# Patient Record
Sex: Male | Born: 1965 | Race: White | Hispanic: No | Marital: Married | Smoking: Current every day smoker
Health system: Southern US, Community
[De-identification: ages and names within clinical notes are randomized; demographics above are authoritative.]

## PROBLEM LIST (undated history)

## (undated) DIAGNOSIS — A6000 Herpesviral infection of urogenital system, unspecified: Secondary | ICD-10-CM

## (undated) DIAGNOSIS — N200 Calculus of kidney: Secondary | ICD-10-CM

## (undated) DIAGNOSIS — N400 Enlarged prostate without lower urinary tract symptoms: Secondary | ICD-10-CM

## (undated) DIAGNOSIS — N486 Induration penis plastica: Secondary | ICD-10-CM

## (undated) HISTORY — DX: Calculus of kidney: N20.0

## (undated) HISTORY — PX: URETEROSCOPY WITH HOLMIUM LASER LITHOTRIPSY: SHX6645

---

## 2009-03-11 ENCOUNTER — Emergency Department: Payer: Self-pay | Admitting: Emergency Medicine

## 2014-02-15 DIAGNOSIS — F17200 Nicotine dependence, unspecified, uncomplicated: Secondary | ICD-10-CM | POA: Insufficient documentation

## 2014-02-15 DIAGNOSIS — R030 Elevated blood-pressure reading, without diagnosis of hypertension: Secondary | ICD-10-CM | POA: Insufficient documentation

## 2016-10-21 DIAGNOSIS — Z87442 Personal history of urinary calculi: Secondary | ICD-10-CM | POA: Insufficient documentation

## 2016-11-11 ENCOUNTER — Encounter: Payer: Self-pay | Admitting: Urology

## 2016-11-11 ENCOUNTER — Ambulatory Visit (INDEPENDENT_AMBULATORY_CARE_PROVIDER_SITE_OTHER): Payer: Self-pay | Admitting: Urology

## 2016-11-11 VITALS — BP 147/80 | HR 82 | Ht 69.0 in | Wt 247.9 lb

## 2016-11-11 DIAGNOSIS — N486 Induration penis plastica: Secondary | ICD-10-CM

## 2016-11-11 DIAGNOSIS — R3911 Hesitancy of micturition: Secondary | ICD-10-CM

## 2016-11-11 LAB — URINALYSIS, COMPLETE
Bilirubin, UA: NEGATIVE
GLUCOSE, UA: NEGATIVE
Ketones, UA: NEGATIVE
Nitrite, UA: NEGATIVE
PH UA: 8.5 — AB (ref 5.0–7.5)
PROTEIN UA: NEGATIVE
Specific Gravity, UA: 1.015 (ref 1.005–1.030)
Urobilinogen, Ur: 0.2 mg/dL (ref 0.2–1.0)

## 2016-11-11 LAB — MICROSCOPIC EXAMINATION
EPITHELIAL CELLS (NON RENAL): NONE SEEN /HPF (ref 0–10)
RBC, UA: NONE SEEN /hpf (ref 0–?)

## 2016-11-11 LAB — BLADDER SCAN AMB NON-IMAGING: Scan Result: 74

## 2016-11-11 MED ORDER — SILDENAFIL CITRATE 20 MG PO TABS: 20.0000 mg | ORAL_TABLET | Freq: Every day | ORAL | 5 refills | Status: DC | PRN

## 2016-11-11 NOTE — Progress Notes (Signed)
11/11/2016 12:52 PM   Daniel Castillo 08-Jul-1965 [id number]  Referring provider: Barbette Reichmann, MD [address] [facility] [place], [place] [address]  No chief complaint on file.   HPI: Pt seen today for a "knot" on the penis and some penile curvature. Some mild soreness with erection. He's noticed it for about a month. Not as sexually active right now and not in a relationship. His erections are adequate, but not "completely" hard and they are softer than in the past. Ejaculation seems normal. He has occasional hesitancy, weak stream, intermittent flow, straining to void. He feels like he empties. PVR 74 ml. He's not bothered by any urinary symptoms.   Modifying factors: There are no other modifying factors  Associated signs and symptoms: There are no other associated signs and symptoms Aggravating and relieving factors: There are no other aggravating or relieving factors Severity: Moderate Duration: Persistent  He was talking a T "pill" by mouth he bought from a store. He lifts weights.   PMH: No past medical history on file.  Surgical History: No past surgical history on file.  Home Medications:  Allergies as of 11/11/2016   Not on File     Medication List    as of 11/11/2016 12:52 PM   You have not been prescribed any medications.     Allergies: Allergies not on file  Family History: No family history on file.  Social History:  has no tobacco, alcohol, and drug history on file.  ROS:                                        Physical Exam: There were no vitals taken for this visit.  Constitutional:  Alert and oriented, No acute distress. HEENT: [place] AT, moist mucus membranes.  Trachea midline, no masses. Cardiovascular: No clubbing, cyanosis, or edema. Respiratory: Normal respiratory effort, no increased work of breathing. GI: Abdomen is soft, nontender, nondistended, no abdominal masses GU: No CVA tenderness.    Penis: hard plaque dorsal, proximal about 10 mm, a little left of center. Circumcised and no lesion. No mass.  Scrotum: normal; testes palpably normal  DRE: 20 g prostate , normal, no hard area or nodule  Skin: No rashes, bruises or suspicious lesions. Lymph: No cervical or inguinal adenopathy. Neurologic: Grossly intact, no focal deficits, moving all 4 extremities. Psychiatric: Normal mood and affect.  Laboratory Data: No results found for: WBC, HGB, HCT, MCV, PLT  No results found for: CREATININE  No results found for: PSA1  No results found for: TESTOSTERONE  No results found for: HGBA1C  Urinalysis No results found for: SPECGRAV, PHUR, COLORU, APPEARANCEUR, LEUKOCYTESUR, PROTEINUR, GLUCOSEU, KETONESU, RBCU, BILIRUBINUR, UUROB, NITRITE  No results found for: LABMICR, WBCUA, RBCUA, LABEPIT, MUCUS, BACTERIA    Assessment & Plan:    1) Peyronie's - he has a dime-sized plaque on the dorsal proximal surface of the penis. A little further on the left side along the neurovascular bundle. We discussed the benign nature of Peyronie's disease and the natural history. I encouraged him to get an erection a few times a week and take sildenafil to promote blood flow and a good erection. We discussed to be careful to avoid any penile bending or flexion to prevent further trauma. We'll see him back in about 3 months to for symptom check and exam. We discussed injections such as Xiaflex if he ever  needs it in the future and I gave him some handouts on Peyronies and Xiaflex.   2) Hesitancy - small prostate, benign. UA and PVR normal. Will follow.   There are no diagnoses linked to this encounter.  No Follow-up on file.  Jerilee Field, MD  [facility] [address], [address] [place], [place] [address] [phone]

## 2017-02-12 ENCOUNTER — Encounter: Payer: Self-pay | Admitting: Urology

## 2017-02-12 ENCOUNTER — Ambulatory Visit (INDEPENDENT_AMBULATORY_CARE_PROVIDER_SITE_OTHER): Payer: Self-pay | Admitting: Urology

## 2017-02-12 VITALS — BP 167/82 | HR 94 | Ht 69.0 in | Wt 260.6 lb

## 2017-02-12 DIAGNOSIS — N486 Induration penis plastica: Secondary | ICD-10-CM

## 2017-02-12 DIAGNOSIS — N529 Male erectile dysfunction, unspecified: Secondary | ICD-10-CM

## 2017-02-12 NOTE — Progress Notes (Signed)
02/12/2017 4:03 PM   [name] Jul 28, 1965 [id number]  Referring provider: Barbette Reichmann, MD [address] [facility] [place], [place] [address]  Chief Complaint  Patient presents with  . Follow-up    HPI: The patient is a 52 year old gentleman who presents today for 3 month follow-up of a dime sized Perrone's plaque on the dorsal proximal surface of his penis he first noticed.  This curvature, plaque, and pain with erections approximately 6 months ago.  He continues to have spontaneous erections at night that are painful at the site of this plaque.  He estimates of 40 degree curvature upward.  He is not currently sexually active. The curvature is not currently concerning to him.  It is more the pain with erections.  He is concerned it might be a risk factor for something more serious per  Patient was also started on sildenafil at his last appointment because he was not status of the firmness of his erections.  However, at that time he was not sexually active.  He is only tried this medication once with mixed results  PMH: Past Medical History:  Diagnosis Date  . Kidney stones     Surgical History: Past Surgical History:  Procedure Laterality Date  . URETEROSCOPY WITH HOLMIUM LASER LITHOTRIPSY      Home Medications:  Allergies as of 02/12/2017   No Known Allergies     Medication List        Accurate as of 02/12/17  4:03 PM. Always use your most recent med list.          sildenafil 20 MG tablet Commonly known as:  REVATIO Take 1 tablet (20 mg total) by mouth daily as needed. Take 1-5 tablets as needed       Allergies: No Known Allergies  Family History: Family History  Problem Relation Age of Onset  . Lung cancer Mother   . Prostate cancer Neg Hx   . Bladder Cancer Neg Hx   . Kidney cancer Neg Hx     Social History:  reports that he has been smoking.  he has never used smokeless tobacco. His alcohol and drug histories are not on  file.  ROS: UROLOGY Frequent Urination?: No Hard to postpone urination?: No Burning/pain with urination?: No Get up at night to urinate?: No Leakage of urine?: No Urine stream starts and stops?: No Trouble starting stream?: Yes Do you have to strain to urinate?: No Blood in urine?: No Urinary tract infection?: No Sexually transmitted disease?: No Injury to kidneys or bladder?: No Painful intercourse?: No Weak stream?: No Erection problems?: No Penile pain?: Yes  Gastrointestinal Nausea?: No Vomiting?: No Indigestion/heartburn?: No Diarrhea?: No Constipation?: No  Constitutional Fever: No Night sweats?: No Weight loss?: No Fatigue?: No  Skin Skin rash/lesions?: No Itching?: No  Eyes Blurred vision?: No Double vision?: No  Ears/Nose/Throat Sore throat?: No Sinus problems?: No  Hematologic/Lymphatic Swollen glands?: No Easy bruising?: No  Cardiovascular Leg swelling?: No Chest pain?: No  Respiratory Cough?: No Shortness of breath?: No  Endocrine Excessive thirst?: No  Musculoskeletal Back pain?: No Joint pain?: No  Neurological Headaches?: No Dizziness?: No  Psychologic Depression?: No Anxiety?: No  Physical Exam: BP (!) 167/82 (BP Location: Right Arm, Patient Position: Sitting, Cuff Size: Large)   Pulse 94   Ht 5\' 9"  (1.753 m)   Wt 260 lb 9.6 oz (118.2 kg)   BMI 38.48 kg/m   Constitutional:  Alert and oriented, No acute distress. HEENT:   AT, moist mucus membranes.  Trachea midline, no masses. Cardiovascular: No clubbing, cyanosis, or edema. Respiratory: Normal respiratory effort, no increased work of breathing. GI: Abdomen is soft, nontender, nondistended, no abdominal masses GU: No CVA tenderness.  Dime-sized plaque at the base of the dorsal side of the penis consistent with Peyronnie's disease Skin: No rashes, bruises or suspicious lesions. Lymph: No cervical or inguinal adenopathy. Neurologic: Grossly intact, no focal  deficits, moving all 4 extremities. Psychiatric: Normal mood and affect.  Laboratory Data: No results found for: WBC, HGB, HCT, MCV, PLT  No results found for: CREATININE  No results found for: PSA  No results found for: TESTOSTERONE  No results found for: HGBA1C  Urinalysis    Component Value Date/Time   APPEARANCEUR Clear 11/11/2016 1311   GLUCOSEU Negative 11/11/2016 1311   BILIRUBINUR Negative 11/11/2016 1311   PROTEINUR Negative 11/11/2016 1311   NITRITE Negative 11/11/2016 1311   LEUKOCYTESUR Trace (A) 11/11/2016 1311    Assessment & Plan:    1.  Peyronnie's disease I discussed with the patient the 2 phases of Peyronnie's disease.  He did discuss there is only symptomatic relief with NSAIDs during the active phase which last approximately 1 year.  This is when he will have pain during erections.  Based on symptomology and when symptoms began, I suspect that his active phase would and in the summer 2019.  We did discuss Xiaflex as a treatment to straighten the penis during the stable phase.  We discussed the risk and benefits of this.  The patient is not bothered by his curvature currently is not interested in pursuing Xiaflex currently when he enters a stable phase.  2.  Erectile dysfunction The patient will continue to try sildenafil as needed.  He will follow-up annually for this medication.  He will follow-up sooner if he decides to pursue Xiaflex this summer.  Return in about 1 year (around 02/12/2018).  Hildred LaserBrian James Constantino Starace, MD  [facility] [address], [address] [place], [place] [id number] [phone]

## 2018-02-10 ENCOUNTER — Ambulatory Visit (INDEPENDENT_AMBULATORY_CARE_PROVIDER_SITE_OTHER): Payer: Self-pay | Admitting: Urology

## 2018-02-10 ENCOUNTER — Encounter: Payer: Self-pay | Admitting: Urology

## 2018-02-10 VITALS — BP 139/87 | HR 91 | Ht 69.0 in | Wt 250.0 lb

## 2018-02-10 DIAGNOSIS — Z8619 Personal history of other infectious and parasitic diseases: Secondary | ICD-10-CM

## 2018-02-10 DIAGNOSIS — N486 Induration penis plastica: Secondary | ICD-10-CM | POA: Insufficient documentation

## 2018-02-10 MED ORDER — VALACYCLOVIR HCL 500 MG PO TABS: ORAL_TABLET | ORAL | 0 refills | Status: DC

## 2018-02-10 NOTE — Progress Notes (Signed)
02/10/2018 4:13 PM   [name] 11-22-1965 [id number]  Referring provider: Barbette Reichmann, MD [address] [facility] [place], [place] [address]  Chief Complaint  Patient presents with  . Abnormal Penile Curvature    1 yr follow up    HPI: 53 year old male previously seen by Dr. Mena Goes and Dr. Sherryl Barters for Peyronie's disease and erectile dysfunction.  He presents for annual follow-up.  At his visit last year he was having painful erections and upward curvature the penis estimated at 30 degrees.  He was not sexually active.  He states there has been no significant change in the curvature and he remains sexually inactive.  His penile pain with erections have resolved.  He states he contracted herpes approximately 20 years ago and has 2-3 outbreaks per year.  He is asking for medication to control these symptoms.  He has no bothersome lower urinary tract symptoms.   PMH: Past Medical History:  Diagnosis Date  . Kidney stones     Surgical History: Past Surgical History:  Procedure Laterality Date  . URETEROSCOPY WITH HOLMIUM LASER LITHOTRIPSY      Home Medications:  Allergies as of 02/10/2018   No Known Allergies     Medication List       Accurate as of February 10, 2018  4:13 PM. Always use your most recent med list.        sildenafil 20 MG tablet Commonly known as:  REVATIO Take 1 tablet (20 mg total) by mouth daily as needed. Take 1-5 tablets as needed       Allergies: No Known Allergies  Family History: Family History  Problem Relation Age of Onset  . Lung cancer Mother   . Prostate cancer Neg Hx   . Bladder Cancer Neg Hx   . Kidney cancer Neg Hx     Social History:  reports that he has been smoking. He has never used smokeless tobacco. No history on file for alcohol and drug.  ROS: UROLOGY Frequent Urination?: No Hard to postpone urination?: No Burning/pain with urination?: No Get up at night to urinate?: No Leakage  of urine?: No Urine stream starts and stops?: No Trouble starting stream?: No Do you have to strain to urinate?: No Blood in urine?: No Urinary tract infection?: No Sexually transmitted disease?: No Injury to kidneys or bladder?: No Painful intercourse?: No Weak stream?: No Erection problems?: No Penile pain?: No  Gastrointestinal Nausea?: No Vomiting?: No Indigestion/heartburn?: No Diarrhea?: No Constipation?: No  Constitutional Fever: No Night sweats?: No Weight loss?: No Fatigue?: No  Skin Skin rash/lesions?: No Itching?: No  Eyes Blurred vision?: Yes Double vision?: No  Ears/Nose/Throat Sore throat?: No Sinus problems?: No  Hematologic/Lymphatic Swollen glands?: No Easy bruising?: No  Cardiovascular Leg swelling?: No Chest pain?: No  Respiratory Cough?: No Shortness of breath?: No  Endocrine Excessive thirst?: No  Musculoskeletal Back pain?: Yes Joint pain?: Yes  Neurological Headaches?: No Dizziness?: No  Psychologic Depression?: No Anxiety?: No  Physical Exam: BP 139/87 (BP Location: Left Arm, Patient Position: Sitting)   Pulse 91   Ht 5\' 9"  (1.753 m)   Wt 250 lb (113.4 kg)   BMI 36.92 kg/m   Constitutional:  Alert and oriented, No acute distress. HEENT: [place] AT, moist mucus membranes.  Trachea midline, no masses. Cardiovascular: No clubbing, cyanosis, or edema. Respiratory: Normal respiratory effort, no increased work of breathing. GI: Abdomen is soft, nontender, nondistended, no abdominal masses GU: No CVA tenderness.  Penis without  lesions.  Mid shaft dorsal plaque, nontender. Lymph: No cervical or inguinal lymphadenopathy. Skin: No rashes, bruises or suspicious lesions. Neurologic: Grossly intact, no focal deficits, moving all 4 extremities. Psychiatric: Normal mood and affect.   Assessment & Plan:   53 year old male with Peyronie's disease and erectile dysfunction.  He is not sexually active and does not desire any  treatment at this time.  Rx valacyclovir 500 mg twice daily x3 days at symptom outbreak was sent to pharmacy.   Riki Altes, MD  [facility] [address], [address] [place], [place] [address] [phone]

## 2018-02-15 ENCOUNTER — Ambulatory Visit: Payer: Self-pay | Admitting: Urology

## 2018-12-06 ENCOUNTER — Other Ambulatory Visit: Payer: Self-pay | Admitting: Urology

## 2018-12-13 ENCOUNTER — Other Ambulatory Visit: Payer: Self-pay | Admitting: Urology

## 2019-02-11 ENCOUNTER — Encounter: Payer: Self-pay | Admitting: Urology

## 2019-02-11 ENCOUNTER — Other Ambulatory Visit: Payer: Self-pay

## 2019-02-11 ENCOUNTER — Ambulatory Visit (INDEPENDENT_AMBULATORY_CARE_PROVIDER_SITE_OTHER): Payer: Self-pay | Admitting: Urology

## 2019-02-11 DIAGNOSIS — A6001 Herpesviral infection of penis: Secondary | ICD-10-CM | POA: Insufficient documentation

## 2019-02-11 DIAGNOSIS — Z8619 Personal history of other infectious and parasitic diseases: Secondary | ICD-10-CM

## 2019-02-11 DIAGNOSIS — N486 Induration penis plastica: Secondary | ICD-10-CM

## 2019-02-11 NOTE — Progress Notes (Signed)
02/11/2019 2:11 PM   [name] [birth year] [id number]  Referring provider: Barbette Reichmann, MD [address] [facility] [place],  [place] [address]  Chief Complaint  Patient presents with  . Medication Refill    HPI: 54 y.o. male presents for annual follow-up.  He has a history of Peyronie's disease however declined treatment as curvature was only 30 degrees and he is not sexually active.  He has a history of herpes with outbreaks 2-3 times per year and is on valacyclovir.  He denies bothersome lower urinary tract symptoms.    PMH: Past Medical History:  Diagnosis Date  . Kidney stones     Surgical History: Past Surgical History:  Procedure Laterality Date  . URETEROSCOPY WITH HOLMIUM LASER LITHOTRIPSY      Home Medications:  Allergies as of 02/11/2019   No Known Allergies     Medication List       Accurate as of February 11, 2019  2:11 PM. If you have any questions, ask your nurse or doctor.        STOP taking these medications   sildenafil 20 MG tablet Commonly known as: REVATIO Stopped by: Riki Altes, MD     TAKE these medications   valACYclovir 500 MG tablet Commonly known as: VALTREX TAKE 1 TABLET BY MOUTH TWICE A DAY FOR 3 DAYS AT FIRST SIGNS OF SYMPTOM OUTBREAK       Allergies: No Known Allergies  Family History: Family History  Problem Relation Age of Onset  . Lung cancer Mother   . Prostate cancer Neg Hx   . Bladder Cancer Neg Hx   . Kidney cancer Neg Hx     Social History:  reports that he has been smoking. He has never used smokeless tobacco. No history on file for alcohol and drug.  ROS: UROLOGY Frequent Urination?: No Hard to postpone urination?: No Burning/pain with urination?: No Get up at night to urinate?: No Leakage of urine?: No Urine stream starts and stops?: No Trouble starting stream?: No Do you have to strain to urinate?: No Blood in urine?: No Urinary tract infection?:  No Sexually transmitted disease?: No Injury to kidneys or bladder?: No Painful intercourse?: No Weak stream?: No Erection problems?: No Penile pain?: No  Gastrointestinal Nausea?: No Vomiting?: No Indigestion/heartburn?: No Diarrhea?: No Constipation?: No  Constitutional Fever: No Night sweats?: No Weight loss?: No Fatigue?: No  Skin Skin rash/lesions?: No Itching?: No  Eyes Blurred vision?: No Double vision?: No  Ears/Nose/Throat Sore throat?: No Sinus problems?: No  Hematologic/Lymphatic Swollen glands?: No Easy bruising?: No  Cardiovascular Leg swelling?: No Chest pain?: No  Respiratory Cough?: No Shortness of breath?: No  Endocrine Excessive thirst?: No  Musculoskeletal Back pain?: No Joint pain?: No  Neurological Headaches?: No Dizziness?: No  Psychologic Depression?: No Anxiety?: No  Physical Exam: BP (!) 143/84 (BP Location: Left Arm, Patient Position: Sitting, Cuff Size: Normal)   Pulse 91   Ht 5\' 9"  (1.753 m)   Wt 265 lb 11.2 oz (120.5 kg)   BMI 39.24 kg/m   Constitutional:  Alert and oriented, No acute distress. HEENT: [place] AT, moist mucus membranes.  Trachea midline, no masses. Cardiovascular: No clubbing, cyanosis, or edema. Respiratory: Normal respiratory effort, no increased work of breathing. Skin: No rashes, bruises or suspicious lesions. Neurologic: Grossly intact, no focal deficits, moving all 4 extremities. Psychiatric: Normal mood and affect.   Assessment & Plan:    - History of genital herpes He presently  does not need refills and will call back.  - Peyronie's disease Does not desire treatment  - Prostate cancer screening We discussed current prostate screen guidelines with recommendations of annual PSA/DRE between the ages of 80-69.  He indicated he would like to have this performed at his annual physical with his PCP.  He did have a PSA in 2015 which was 0.21.  Return for 1 yr follow up.  Abbie Sons,  [place] [address] [place], [place] [address] [phone]

## 2019-12-14 ENCOUNTER — Ambulatory Visit (INDEPENDENT_AMBULATORY_CARE_PROVIDER_SITE_OTHER): Payer: Self-pay | Admitting: Physician Assistant

## 2019-12-14 ENCOUNTER — Encounter: Payer: Self-pay | Admitting: Physician Assistant

## 2019-12-14 ENCOUNTER — Other Ambulatory Visit: Payer: Self-pay

## 2019-12-14 VITALS — BP 151/89 | HR 84

## 2019-12-14 DIAGNOSIS — N401 Enlarged prostate with lower urinary tract symptoms: Secondary | ICD-10-CM

## 2019-12-14 DIAGNOSIS — R3911 Hesitancy of micturition: Secondary | ICD-10-CM

## 2019-12-14 DIAGNOSIS — R310 Gross hematuria: Secondary | ICD-10-CM

## 2019-12-14 LAB — BLADDER SCAN AMB NON-IMAGING: Scan Result: 88

## 2019-12-14 MED ORDER — TAMSULOSIN HCL 0.4 MG PO CAPS: 0.4000 mg | ORAL_CAPSULE | Freq: Every day | ORAL | 2 refills | Status: DC

## 2019-12-14 NOTE — Progress Notes (Signed)
12/14/2019 2:56 PM   Daniel Castillo Daniel Castillo 03/04/1965 [id number]  CC: Chief Complaint  Patient presents with  . Other    Difficulty urinating   HPI: Daniel Castillo is a 54 y.o. male with PMH Peyronie's disease on surveillance and genital herpes on valacyclovir who presents today for evaluation of difficulty urinating.  Today he reports intermittent urinary urgency, hesitancy, and double voiding.  He states his symptoms have been ongoing for at least the last year, however they are becoming more bothersome and frequent.  He also reports some lower abdominal discomfort with termination.  No known family history of BPH.  He denies dysuria, flank pain, and recent gross hematuria.  Notably, he reports an isolated episode of gross hematuria occurring several years ago without recurrence.  He states this was associated with urinary straining.  He is a current smoker with an approximate 60-pack-year smoking history.  IPSS 7/2 as below.  Most recent available PSA dated 2015 was 0.21.  He previously deferred annual PSA and DRE to his PCP, however it does not appear this has been done.  In-office UA today positive for trace intact blood; urine microscopy with granular casts. PVR 76mL.   IPSS    Row Name 12/14/19 1400         International Prostate Symptom Score   How often have you had the sensation of not emptying your bladder? Less than 1 in 5     How often have you had to urinate less than every two hours? Less than 1 in 5 times     How often have you found you stopped and started again several times when you urinated? Less than 1 in 5 times     How often have you found it difficult to postpone urination? Less than 1 in 5 times     How often have you had a weak urinary stream? Less than half the time     How often have you had to strain to start urination? Less than 1 in 5 times     How many times did you typically get up at night to urinate? None     Total IPSS Score 7       Quality  of Life due to urinary symptoms   If you were to spend the rest of your life with your urinary condition just the way it is now how would you feel about that? Mixed            PMH: Past Medical History:  Diagnosis Date  . Kidney stones     Surgical History: Past Surgical History:  Procedure Laterality Date  . URETEROSCOPY WITH HOLMIUM LASER LITHOTRIPSY      Home Medications:  Allergies as of 12/14/2019   No Known Allergies     Medication List       Accurate as of December 14, 2019  2:56 PM. If you have any questions, ask your nurse or doctor.        valACYclovir 500 MG tablet Commonly known as: VALTREX TAKE 1 TABLET BY MOUTH TWICE A DAY FOR 3 DAYS AT FIRST SIGNS OF SYMPTOM OUTBREAK       Allergies:  No Known Allergies  Family History: Family History  Problem Relation Age of Onset  . Lung cancer Mother   . Prostate cancer Neg Hx   . Bladder Cancer Neg Hx   . Kidney cancer Neg Hx     Social History:   reports that he has  been smoking. He has never used smokeless tobacco. No history on file for alcohol use and drug use.  Physical Exam: BP (!) 151/89   Pulse 84   Constitutional:  Alert and oriented, no acute distress, nontoxic appearing HEENT: [place], AT Cardiovascular: No clubbing, cyanosis, or edema Respiratory: Normal respiratory effort, no increased work of breathing GU: Normal sphincter tone, smooth and symmetrically enlarged approximate 40+ cc prostate without nodules or induration.  Exam limited to the apex. Skin: No rashes, bruises or suspicious lesions Neurologic: Grossly intact, no focal deficits, moving all 4 extremities Psychiatric: Normal mood and affect  Laboratory Data: Results for orders placed or performed in visit on 12/14/19  Microscopic Examination   Urine  Result Value Ref Range   WBC, UA 0-5 0 - 5 /hpf   RBC 0-2 0 - 2 /hpf   Epithelial Cells (non renal) 0-10 0 - 10 /hpf   Casts Present (A) None seen /lpf   Cast Type Granular casts  (A) N/A   Bacteria, UA None seen None seen/Few  Urinalysis, Complete  Result Value Ref Range   Specific Gravity, UA 1.025 1.005 - 1.030   pH, UA 7.0 5.0 - 7.5   Color, UA Yellow Yellow   Appearance Ur Hazy (A) Clear   Leukocytes,UA Negative Negative   Protein,UA Negative Negative/Trace   Glucose, UA Negative Negative   Ketones, UA Negative Negative   RBC, UA Trace (A) Negative   Bilirubin, UA Negative Negative   Urobilinogen, Ur 0.2 0.2 - 1.0 mg/dL   Nitrite, UA Negative Negative   Microscopic Examination See below:   PSA  Result Value Ref Range   Prostate Specific Ag, Serum 0.2 0.0 - 4.0 ng/mL  Bladder Scan (Post Void Residual) in office  Result Value Ref Range   Scan Result 88    Assessment & Plan:   1. Benign prostatic hyperplasia with urinary hesitancy Progressive worsening of LUTS in the setting of a mildly enlarged prostate on DRE today.  He is due to begin annual surveillance PSAs, will obtain today.  PVR WNL and UA benign today.  Recommend initiation of Flomax and symptom recheck as scheduled annual follow-up with Dr. Lonna Cobb in early 2022. - Urinalysis, Complete - Bladder Scan (Post Void Residual) in office - PSA - tamsulosin (FLOMAX) 0.4 MG CAPS capsule; Take 1 capsule (0.4 mg total) by mouth daily.  Dispense: 30 capsule; Refill: 2  2. Hematuria, gross Patient reports a single, isolated episode of gross hematuria associated with straining several years ago.  No clinical data available to correlate this.  He has no microscopic hematuria today.  I counseled the patient to return to clinic with recurrence of gross hematuria, especially given his smoking history.  Will recommend hematuria work-up at that point.  Return in about 2 months (around 02/13/2020) for Annual follow up with Dr. Lonna Cobb (scheduled).  Carman Ching, PA-C  [facility] [address] [place], [place] [address] [phone]

## 2019-12-15 ENCOUNTER — Telehealth: Payer: Self-pay | Admitting: Physician Assistant

## 2019-12-15 LAB — MICROSCOPIC EXAMINATION: Bacteria, UA: NONE SEEN

## 2019-12-15 LAB — URINALYSIS, COMPLETE
Bilirubin, UA: NEGATIVE
Glucose, UA: NEGATIVE
Ketones, UA: NEGATIVE
Leukocytes,UA: NEGATIVE
Nitrite, UA: NEGATIVE
Protein,UA: NEGATIVE
Specific Gravity, UA: 1.025 (ref 1.005–1.030)
Urobilinogen, Ur: 0.2 mg/dL (ref 0.2–1.0)
pH, UA: 7 (ref 5.0–7.5)

## 2019-12-15 LAB — PSA: Prostate Specific Ag, Serum: 0.2 ng/mL (ref 0.0–4.0)

## 2019-12-15 NOTE — Telephone Encounter (Signed)
Please contact the patient and inform him that his PSA was 0.2, well within the normal healthy range.  We will continue annual PSAs and DRE's to evaluate his prostate health.

## 2019-12-15 NOTE — Telephone Encounter (Signed)
Per DPR [facility] notifying patient as advised.

## 2020-01-09 ENCOUNTER — Telehealth: Payer: Self-pay

## 2020-01-09 NOTE — Telephone Encounter (Signed)
Patient called stating that he has been taking the Flomax as directed but it has not helped his urinary symptoms. He states that his frequency and urgency is worse and he sometimes has a shooting/burning sensation at the tip of his penis when he is emptying his bladder. He wants to know if he can try doubling the Flomax if this might help his symptoms or if another medication might help?

## 2020-01-10 MED ORDER — OXYBUTYNIN CHLORIDE ER 10 MG PO TB24: 10.0000 mg | ORAL_TABLET | Freq: Every day | ORAL | 0 refills | Status: DC

## 2020-01-10 NOTE — Telephone Encounter (Signed)
Pt calls triage line. Informed him of the information below per provider. Pt voiced understanding. Flomax removed from med list.

## 2020-01-10 NOTE — Telephone Encounter (Signed)
Please contact the patient and counsel him to stop Flomax given symptomatic worsening on this medication.  Please explain that I would like him to start a trial of an overactive bladder medication at this time.  I have sent oxybutynin XL 10 mg to the [facility].  I would like him to start this medication with plans to follow-up with symptom recheck and PVR with Dr. Lonna Cobb at his upcoming scheduled appointment.

## 2020-02-01 ENCOUNTER — Other Ambulatory Visit: Payer: Self-pay | Admitting: Physician Assistant

## 2020-02-15 ENCOUNTER — Ambulatory Visit: Payer: Self-pay | Admitting: Urology

## 2020-02-25 ENCOUNTER — Other Ambulatory Visit: Payer: Self-pay | Admitting: Physician Assistant

## 2020-03-06 ENCOUNTER — Other Ambulatory Visit: Payer: Self-pay | Admitting: Physician Assistant

## 2020-03-06 DIAGNOSIS — N401 Enlarged prostate with lower urinary tract symptoms: Secondary | ICD-10-CM

## 2020-03-07 ENCOUNTER — Other Ambulatory Visit: Payer: Self-pay

## 2020-03-07 ENCOUNTER — Ambulatory Visit: Payer: Self-pay | Admitting: Urology

## 2020-03-07 ENCOUNTER — Encounter: Payer: Self-pay | Admitting: Urology

## 2020-03-07 VITALS — BP 149/82 | HR 88 | Ht 69.0 in | Wt 270.0 lb

## 2020-03-07 DIAGNOSIS — R3915 Urgency of urination: Secondary | ICD-10-CM

## 2020-03-07 DIAGNOSIS — R350 Frequency of micturition: Secondary | ICD-10-CM

## 2020-03-07 DIAGNOSIS — N401 Enlarged prostate with lower urinary tract symptoms: Secondary | ICD-10-CM

## 2020-03-07 DIAGNOSIS — R3911 Hesitancy of micturition: Secondary | ICD-10-CM

## 2020-03-07 LAB — BLADDER SCAN AMB NON-IMAGING: Scan Result: 29

## 2020-03-07 MED ORDER — VALACYCLOVIR HCL 500 MG PO TABS: ORAL_TABLET | ORAL | 1 refills | Status: DC

## 2020-03-07 NOTE — Progress Notes (Signed)
   03/07/2020 1:44 PM   [name] 1965/03/18 [id number]  Referring provider: Barbette Reichmann, MD [address] [facility] [place],  [place] [address]  Chief Complaint  Patient presents with  . Abnormal Penile Curvature    HPI: 55 y.o. male presents for follow-up.   Initially seen for Peyronie's disease but declined treatment  Saw S. Vaillancourt in mid November for voiding difficulty primarily urinary frequency and urgency  Was given a trial of Flomax which was not beneficial  Started extended release oxybutynin 10 mg December 2021  Essentially resolution of his symptoms on oxybutynin  PSA 11/21 0.2   PMH: Past Medical History:  Diagnosis Date  . Kidney stones     Surgical History: Past Surgical History:  Procedure Laterality Date  . URETEROSCOPY WITH HOLMIUM LASER LITHOTRIPSY      Home Medications:  Allergies as of 03/07/2020   No Known Allergies     Medication List       Accurate as of March 07, 2020  1:44 PM. If you have any questions, ask your nurse or doctor.        oxybutynin 10 MG 24 hr tablet Commonly known as: DITROPAN-XL TAKE 1 TABLET BY MOUTH EVERY DAY   valACYclovir 500 MG tablet Commonly known as: VALTREX TAKE 1 TABLET BY MOUTH TWICE A DAY FOR 3 DAYS AT FIRST SIGNS OF SYMPTOM OUTBREAK       Allergies: No Known Allergies  Family History: Family History  Problem Relation Age of Onset  . Lung cancer Mother   . Prostate cancer Neg Hx   . Bladder Cancer Neg Hx   . Kidney cancer Neg Hx     Social History:  reports that he has been smoking. He has never used smokeless tobacco. No history on file for alcohol use and drug use.   Physical Exam: BP (!) 149/82   Pulse 88   Ht 5\' 9"  (1.753 m)   Wt 270 lb (122.5 kg)   BMI 39.87 kg/m   Constitutional:  Alert and oriented, No acute distress. HEENT: [place] AT, moist mucus membranes.  Trachea midline, no masses. Cardiovascular: No clubbing, cyanosis, or  edema. Respiratory: Normal respiratory effort, no increased work of breathing.   Assessment & Plan:    1.  Urinary frequency/urgency  Resolved on oxybutynin  Bladder scan PVR 29 mL  Follow-up annually  He was informed he could attempt discontinuing the medication if desired and restart for recurrent symptoms  2.  History of genital herpes  Requested refill valacyclovir   , MD  [facility] [address] [place], [place] [phone]

## 2020-03-16 ENCOUNTER — Other Ambulatory Visit: Payer: Self-pay | Admitting: Urology

## 2020-03-24 ENCOUNTER — Other Ambulatory Visit: Payer: Self-pay | Admitting: Physician Assistant

## 2020-04-27 ENCOUNTER — Other Ambulatory Visit: Payer: Self-pay

## 2020-04-27 ENCOUNTER — Ambulatory Visit (INDEPENDENT_AMBULATORY_CARE_PROVIDER_SITE_OTHER): Payer: Self-pay | Admitting: Physician Assistant

## 2020-04-27 ENCOUNTER — Encounter: Payer: Self-pay | Admitting: Physician Assistant

## 2020-04-27 VITALS — BP 140/78 | HR 76 | Ht 69.0 in | Wt 265.0 lb

## 2020-04-27 DIAGNOSIS — R310 Gross hematuria: Secondary | ICD-10-CM

## 2020-04-27 DIAGNOSIS — N401 Enlarged prostate with lower urinary tract symptoms: Secondary | ICD-10-CM

## 2020-04-27 DIAGNOSIS — R3911 Hesitancy of micturition: Secondary | ICD-10-CM

## 2020-04-27 LAB — URINALYSIS, COMPLETE
Bilirubin, UA: NEGATIVE
Glucose, UA: NEGATIVE
Ketones, UA: NEGATIVE
Leukocytes,UA: NEGATIVE
Nitrite, UA: NEGATIVE
Protein,UA: NEGATIVE
Specific Gravity, UA: 1.02 (ref 1.005–1.030)
Urobilinogen, Ur: 0.2 mg/dL (ref 0.2–1.0)
pH, UA: 7 (ref 5.0–7.5)

## 2020-04-27 LAB — MICROSCOPIC EXAMINATION
Bacteria, UA: NONE SEEN
Epithelial Cells (non renal): NONE SEEN /hpf (ref 0–10)

## 2020-04-27 LAB — BLADDER SCAN AMB NON-IMAGING: Scan Result: 66

## 2020-04-27 NOTE — Progress Notes (Signed)
04/27/2020 1:28 PM   Daniel Castillo 28-Aug-1965 [id number]  CC: Chief Complaint  Patient presents with  . Other   HPI: Daniel Castillo is a 55 y.o. male with PMH Peyronie's disease on surveillance, genital herpes on valacyclovir, nephrolithiasis s/p ureteroscopy, and BPH with urinary hesitancy, frequency, and urgency on oxybutynin XL 10 mg who presents today for evaluation of difficulty urinating.  He was previously started on Flomax alone and reported worsened urgency and frequency on this.  Today he reports a several weeks long history of worsened urinary intermittency and discomfort.  He states the symptoms do not occur every day and are not associated with constipation.  He stopped oxybutynin 7 days ago due to this and feels better.  Notably, he again reports an episode of dark urine 1 week ago.  He is unsure if this episode represents gross hematuria.  IPSS 5/mostly dissatisfied as below, previously 7/mixed.  In-office UA today positive for trace intact blood; urine microscopy pan negative. PVR 78mL.   IPSS    Row Name 04/27/20 1300         International Prostate Symptom Score   How often have you had the sensation of not emptying your bladder? About half the time     How often have you had to urinate less than every two hours? Less than half the time     How often have you found you stopped and started again several times when you urinated? Not at All     How often have you found it difficult to postpone urination? Not at All     How often have you had a weak urinary stream? Not at All     How often have you had to strain to start urination? Not at All     How many times did you typically get up at night to urinate? None     Total IPSS Score 5           Quality of Life due to urinary symptoms   If you were to spend the rest of your life with your urinary condition just the way it is now how would you feel about that? Mostly Disatisfied            PMH: Past  Medical History:  Diagnosis Date  . Kidney stones     Surgical History: Past Surgical History:  Procedure Laterality Date  . URETEROSCOPY WITH HOLMIUM LASER LITHOTRIPSY      Home Medications:  Allergies as of 04/27/2020   No Known Allergies     Medication List       Accurate as of April 27, 2020  1:28 PM. If you have any questions, ask your nurse or doctor.        oxybutynin 10 MG 24 hr tablet Commonly known as: DITROPAN-XL TAKE 1 TABLET BY MOUTH EVERY DAY   valACYclovir 500 MG tablet Commonly known as: VALTREX TAKE 1 TABLET BY MOUTH TWICE A DAY FOR 3 DAYS AT FIRST SIGNS OF SYMPTOM OUTBREAK       Allergies:  No Known Allergies  Family History: Family History  Problem Relation Age of Onset  . Lung cancer Mother   . Prostate cancer Neg Hx   . Bladder Cancer Neg Hx   . Kidney cancer Neg Hx     Social History:   reports that he has been smoking. He has never used smokeless tobacco. No history on file for alcohol use and drug use.  Physical  Exam: BP 140/78   Pulse 76   Ht 5\' 9"  (1.753 m)   Wt 265 lb (120.2 kg)   BMI 39.13 kg/m   Constitutional:  Alert and oriented, no acute distress, nontoxic appearing HEENT: [place], AT Cardiovascular: No clubbing, cyanosis, or edema Respiratory: Normal respiratory effort, no increased work of breathing Skin: No rashes, bruises or suspicious lesions Neurologic: Grossly intact, no focal deficits, moving all 4 extremities Psychiatric: Normal mood and affect  Laboratory Data: Results for orders placed or performed in visit on 04/27/20  Microscopic Examination   Urine  Result Value Ref Range   WBC, UA 0-5 0 - 5 /hpf   RBC 0-2 0 - 2 /hpf   Epithelial Cells (non renal) None seen 0 - 10 /hpf   Bacteria, UA None seen None seen/Few  Urinalysis, Complete  Result Value Ref Range   Specific Gravity, UA 1.020 1.005 - 1.030   pH, UA 7.0 5.0 - 7.5   Color, UA Yellow Yellow   Appearance Ur Clear Clear   Leukocytes,UA Negative  Negative   Protein,UA Negative Negative/Trace   Glucose, UA Negative Negative   Ketones, UA Negative Negative   RBC, UA Trace (A) Negative   Bilirubin, UA Negative Negative   Urobilinogen, Ur 0.2 0.2 - 1.0 mg/dL   Nitrite, UA Negative Negative   Microscopic Examination See below:   Bladder Scan (Post Void Residual) in office  Result Value Ref Range   Scan Result 66    Assessment & Plan:   1. Benign prostatic hyperplasia with urinary hesitancy Worsened intermittency and hesitancy on oxybutynin XL 10 mg daily, improved off this medication.  He previously failed Flomax with reports of increased urgency and frequency.  We have not yet tried combination therapy and may pursue this in the future.  With his ongoing symptoms and #2 below, recommend cystoscopy for further evaluation.  Patient expressed understanding. - Urinalysis, Complete - Bladder Scan (Post Void Residual) in office  2. Hematuria, gross No microscopic hematuria today, however patient continues to report episodes of dark urine that may or may not represent gross hematuria.  Recommended cystoscopy at this time for further evaluation.  He expressed understanding  Return in about 2 weeks (around 05/11/2020) for Cystoscopy with Dr. 05/13/2020.  Lonna Cobb, PA-C  [facility] [address], [address] [place], [place] [phone]

## 2020-05-04 ENCOUNTER — Telehealth: Payer: Self-pay | Admitting: *Deleted

## 2020-05-04 NOTE — Telephone Encounter (Signed)
Received referral for low dose lung cancer screening CT scan. Message left at phone number listed in EMR for patient to call me back to facilitate scheduling scan.  

## 2020-05-11 ENCOUNTER — Telehealth: Payer: Self-pay | Admitting: *Deleted

## 2020-05-11 ENCOUNTER — Encounter: Payer: Self-pay | Admitting: *Deleted

## 2020-05-11 DIAGNOSIS — Z87891 Personal history of nicotine dependence: Secondary | ICD-10-CM

## 2020-05-11 DIAGNOSIS — F17200 Nicotine dependence, unspecified, uncomplicated: Secondary | ICD-10-CM

## 2020-05-11 DIAGNOSIS — Z122 Encounter for screening for malignant neoplasm of respiratory organs: Secondary | ICD-10-CM

## 2020-05-11 NOTE — Telephone Encounter (Signed)
Received referral for initial lung cancer screening scan. Contacted patient and obtained smoking history,(current every day smoker, 1 ppd x 40 yrs) as well as answering questions related to screening process. Patient denies signs of lung cancer such as weight loss or hemoptysis. Patient denies comorbidity that would prevent curative treatment if lung cancer were found. Patient is scheduled for shared decision making visit and CT scan on 06/13/20 @ 10:45 am.

## 2020-05-16 ENCOUNTER — Other Ambulatory Visit: Payer: Self-pay | Admitting: Urology

## 2020-06-13 ENCOUNTER — Ambulatory Visit
Admission: RE | Admit: 2020-06-13 | Discharge: 2020-06-13 | Disposition: A | Discharge status: Home / Self Care | Payer: Self-pay | Source: Ambulatory Visit | Attending: Oncology | Admitting: Oncology

## 2020-06-13 ENCOUNTER — Other Ambulatory Visit: Payer: Self-pay

## 2020-06-13 ENCOUNTER — Inpatient Hospital Stay: Payer: Self-pay | Attending: Oncology | Admitting: Hospice and Palliative Medicine

## 2020-06-13 DIAGNOSIS — Z122 Encounter for screening for malignant neoplasm of respiratory organs: Secondary | ICD-10-CM | POA: Insufficient documentation

## 2020-06-13 DIAGNOSIS — Z87891 Personal history of nicotine dependence: Secondary | ICD-10-CM

## 2020-06-13 DIAGNOSIS — F17200 Nicotine dependence, unspecified, uncomplicated: Secondary | ICD-10-CM | POA: Insufficient documentation

## 2020-06-13 NOTE — Progress Notes (Signed)
Virtual Visit via Video Note  I connected with@ on 06/13/20 at@ by a video enabled telemedicine application and verified that I am speaking with the correct person using two identifiers.   I discussed the limitations of evaluation and management by telemedicine and the availability of in person appointments. The patient expressed understanding and agreed to proceed.  Location: Patient: [facility] Provider: Clinic   In accordance with CMS guidelines, patient has met eligibility criteria including age, absence of signs or symptoms of lung cancer.  Social History   Tobacco Use  . Smoking status: Current Every Day Smoker    Packs/day: 1.00    Years: 40.00    Pack years: 40.00    Types: Cigarettes  . Smokeless tobacco: Never Used  Vaping Use  . Vaping Use: Never used      A shared decision-making session was conducted prior to the performance of CT scan. This includes one or more decision aids, includes benefits and harms of screening, follow-up diagnostic testing, over-diagnosis, false positive rate, and total radiation exposure.   Counseling on the importance of adherence to annual lung cancer LDCT screening, impact of co-morbidities, and ability or willingness to undergo diagnosis and treatment is imperative for compliance of the program.   Counseling on the importance of continued smoking cessation for former smokers; the importance of smoking cessation for current smokers, and information about tobacco cessation interventions have been given to patient including [place] and 1800 quit [place] programs.   Written order for lung cancer screening with LDCT has been given to the patient and any and all questions have been answered to the best of my abilities.    Yearly follow up will be coordinated by Burgess Estelle, Thoracic Navigator.  Time Total: 15 minutes  Visit consisted of counseling and education dealing with complex health screening. Greater than 50%  of this time was  spent counseling and coordinating care related to the above assessment and plan.  Signed by: Altha Harm, PhD, NP-C

## 2020-06-21 ENCOUNTER — Encounter: Payer: Self-pay | Admitting: *Deleted

## 2020-07-02 ENCOUNTER — Telehealth: Payer: Self-pay | Admitting: Family Medicine

## 2020-07-02 ENCOUNTER — Ambulatory Visit (INDEPENDENT_AMBULATORY_CARE_PROVIDER_SITE_OTHER): Payer: Self-pay | Admitting: Physician Assistant

## 2020-07-02 ENCOUNTER — Other Ambulatory Visit: Payer: Self-pay

## 2020-07-02 ENCOUNTER — Encounter: Payer: Self-pay | Admitting: Physician Assistant

## 2020-07-02 VITALS — BP 146/80 | HR 87 | Ht 69.0 in | Wt 265.0 lb

## 2020-07-02 DIAGNOSIS — R3911 Hesitancy of micturition: Secondary | ICD-10-CM

## 2020-07-02 DIAGNOSIS — R300 Dysuria: Secondary | ICD-10-CM

## 2020-07-02 DIAGNOSIS — R3129 Other microscopic hematuria: Secondary | ICD-10-CM

## 2020-07-02 DIAGNOSIS — N401 Enlarged prostate with lower urinary tract symptoms: Secondary | ICD-10-CM

## 2020-07-02 LAB — BLADDER SCAN AMB NON-IMAGING: Scan Result: 53

## 2020-07-02 NOTE — Progress Notes (Signed)
07/02/2020 9:09 AM   [name] 05-19-1965 [id number]  CC: Chief Complaint  Patient presents with  . Dysuria   HPI: Daniel Castillo is a 55 y.o. male with PMH Peyronie's disease on surveillance, genital herpes on valacyclovir, nephrolithiasis s/p ureteroscopy, and BPH with urinary hesitancy, frequency, and urgency previously on oxybutynin XL 10 mg who presents today for evaluation of dysuria.  I saw him in clinic most recently on 04/27/2020, at which point he reported a several weeks long history of worsened intermittency and discomfort, which improved after stopping oxybutynin.  He also reported several episodes of dark urine, unclear if this represented gross hematuria.  He was recommended to pursue cystoscopy at that point, however he canceled this appointment after receiving antibiotics from his PCP which improved his symptoms.  Today he reports an ongoing history of continued dysuria, urgency, frequency, and straining that acutely worsened yesterday.  He describes some constant, mild pain in the penis associated with this.  He denies gross hematuria, hematospermia, pain with ejaculation, flank pain, fever, chills, nausea, vomiting, and unintentional weight loss.   As previously noted, he is a current smoker with an approximate 60-pack-year smoking history.  In-office UA today positive for 1+ blood; urine microscopy with 3-10 RBCs/HPF. PVR 40mL.  PMH: Past Medical History:  Diagnosis Date  . Kidney stones     Surgical History: Past Surgical History:  Procedure Laterality Date  . URETEROSCOPY WITH HOLMIUM LASER LITHOTRIPSY      Home Medications:  Allergies as of 07/02/2020   No Known Allergies     Medication List       Accurate as of July 02, 2020  9:09 AM. If you have any questions, ask your nurse or doctor.        oxybutynin 10 MG 24 hr tablet Commonly known as: DITROPAN-XL TAKE 1 TABLET BY MOUTH EVERY DAY   valACYclovir 500 MG tablet Commonly known as:  VALTREX TAKE 1 TABLET BY MOUTH TWICE A DAY FOR 3 DAYS AT FIRST SIGNS OF SYMPTOM OUTBREAK       Allergies:  No Known Allergies  Family History: Family History  Problem Relation Age of Onset  . Lung cancer Mother   . Prostate cancer Neg Hx   . Bladder Cancer Neg Hx   . Kidney cancer Neg Hx     Social History:   reports that he has been smoking cigarettes. He has a 40.00 pack-year smoking history. He has never used smokeless tobacco. No history on file for alcohol use and drug use.  Physical Exam: BP (!) 146/80   Pulse 87   Ht 5\' 9"  (1.753 m)   Wt 265 lb (120.2 kg)   BMI 39.13 kg/m   Constitutional:  Alert and oriented, no acute distress, nontoxic appearing HEENT: [place], AT Cardiovascular: No clubbing, cyanosis, or edema Respiratory: Normal respiratory effort, no increased work of breathing Skin: No rashes, bruises or suspicious lesions Neurologic: Grossly intact, no focal deficits, moving all 4 extremities Psychiatric: Normal mood and affect  Laboratory Data: Results for orders placed or performed in visit on 07/02/20  Microscopic Examination   Urine  Result Value Ref Range   WBC, UA 0-5 0 - 5 /hpf   RBC 3-10 (A) 0 - 2 /hpf   Epithelial Cells (non renal) 0-10 0 - 10 /hpf   Bacteria, UA None seen None seen/Few  Urinalysis, Complete  Result Value Ref Range   Specific Gravity, UA 1.025 1.005 - 1.030   pH, UA 6.5  5.0 - 7.5   Color, UA Yellow Yellow   Appearance Ur Hazy (A) Clear   Leukocytes,UA Negative Negative   Protein,UA Negative Negative/Trace   Glucose, UA Negative Negative   Ketones, UA Negative Negative   RBC, UA Trace (A) Negative   Bilirubin, UA Negative Negative   Urobilinogen, Ur 0.2 0.2 - 1.0 mg/dL   Nitrite, UA Negative Negative   Microscopic Examination See below:   Bladder Scan (Post Void Residual) in office  Result Value Ref Range   Scan Result 53    Assessment & Plan:   1. Dysuria UA notable today only for microscopic hematuria, low suspicion  for infection.  PVR WNL.  With his reports of ongoing penile pain, this may represent an episode of prostatitis and I counseled him to restart Flomax.  Patient states he already has this medication at home, so I am deferring prescribing it today. - Bladder Scan (Post Void Residual) in office - Urinalysis, Complete  2. Microscopic hematuria Noted on UA today.  With his extensive smoking history, he is considered high risk per AUA guidelines and recommend that we proceed with hematuria work-up at this time.  We discussed that there are numerous possible etiologies of blood in the urine including infection, stones, BPH, anticoagulation, and malignancies of the bladder or kidneys and that the work-up is comprised of cross-sectional imaging and a cystoscopy for full evaluation of the urinary tract.  Due to the national contrast shortage, will pursue noncontrast CT today.  I explained to the patient the difference between contrast and noncontrast studies, explained limitations of a noncontrast study, and explained that if there are any significant findings, he may ultimately require bilateral retrograde pyelograms under anesthesia.  He expressed understanding. - CT RENAL STONE STUDY; Future   Return in about 4 weeks (around 07/30/2020) for Cysto and CT results.  Carman Ching, PA-C  [facility] [address] [place], [place] [address] [phone]

## 2020-07-02 NOTE — Patient Instructions (Addendum)
Restart Flomax (tamsulosin) 0.4 mg daily. If you run out of this medication, call our office and I will refill it.  Cystoscopy Cystoscopy is a procedure that is used to help diagnose and sometimes treat conditions that affect the lower urinary tract. The lower urinary tract includes the bladder and the urethra. The urethra is the tube that drains urine from the bladder. Cystoscopy is done using a thin, tube-shaped instrument with a light and camera at the end (cystoscope). The cystoscope may be hard or flexible, depending on the goal of the procedure. The cystoscope is inserted through the urethra, into the bladder. Cystoscopy may be recommended if you have:  Urinary tract infections that keep coming back.  Blood in the urine (hematuria).  An inability to control when you urinate (urinary incontinence) or an overactive bladder.  Unusual cells found in a urine sample.  A blockage in the urethra, such as a urinary stone.  Painful urination.  An abnormality in the bladder found during an intravenous pyelogram (IVP) or CT scan. Cystoscopy may also be done to remove a sample of tissue to be examined under a microscope (biopsy). What are the risks? Generally, this is a safe procedure. However, problems may occur, including:  Infection.  Bleeding.  What happens during the procedure?  1. You will be given one or more of the following: ? A medicine to numb the area (local anesthetic). 2. The area around the opening of your urethra will be cleaned. 3. The cystoscope will be passed through your urethra into your bladder. 4. Germ-free (sterile) fluid will flow through the cystoscope to fill your bladder. The fluid will stretch your bladder so that your health care provider can clearly examine your bladder walls. 5. Your doctor will look at the urethra and bladder. 6. The cystoscope will be removed The procedure may vary among health care providers  What can I expect after the  procedure? After the procedure, it is common to have: 1. Some soreness or pain in your abdomen and urethra. 2. Urinary symptoms. These include: ? Mild pain or burning when you urinate. Pain should stop within a few minutes after you urinate. This may last for up to 1 week. ? A small amount of blood in your urine for several days. ? Feeling like you need to urinate but producing only a small amount of urine. Follow these instructions at home: General instructions  Return to your normal activities as told by your health care provider.   Do not drive for 24 hours if you were given a sedative during your procedure.  Watch for any blood in your urine. If the amount of blood in your urine increases, call your health care provider.  If a tissue sample was removed for testing (biopsy) during your procedure, it is up to you to get your test results. Ask your health care provider, or the department that is doing the test, when your results will be ready.  Drink enough fluid to keep your urine pale yellow.  Keep all follow-up visits as told by your health care provider. This is important. Contact a health care provider if you:  Have pain that gets worse or does not get better with medicine, especially pain when you urinate.  Have trouble urinating.  Have more blood in your urine. Get help right away if you:  Have blood clots in your urine.  Have abdominal pain.  Have a fever or chills.  Are unable to urinate. Summary  Cystoscopy is a  procedure that is used to help diagnose and sometimes treat conditions that affect the lower urinary tract.  Cystoscopy is done using a thin, tube-shaped instrument with a light and camera at the end.  After the procedure, it is common to have some soreness or pain in your abdomen and urethra.  Watch for any blood in your urine. If the amount of blood in your urine increases, call your health care provider.  If you were prescribed an antibiotic  medicine, take it as told by your health care provider. Do not stop taking the antibiotic even if you start to feel better. This information is not intended to replace advice given to you by your health care provider. Make sure you discuss any questions you have with your health care provider. Document Revised: 01/05/2018 Document Reviewed: 01/05/2018 Elsevier Patient Education  2020 [employer].

## 2020-07-02 NOTE — Telephone Encounter (Signed)
Patient called stating he is having dysuria. An appointment has been made for today.

## 2020-07-03 LAB — URINALYSIS, COMPLETE
Bilirubin, UA: NEGATIVE
Glucose, UA: NEGATIVE
Ketones, UA: NEGATIVE
Leukocytes,UA: NEGATIVE
Nitrite, UA: NEGATIVE
Protein,UA: NEGATIVE
Specific Gravity, UA: 1.025 (ref 1.005–1.030)
Urobilinogen, Ur: 0.2 mg/dL (ref 0.2–1.0)
pH, UA: 6.5 (ref 5.0–7.5)

## 2020-07-03 LAB — MICROSCOPIC EXAMINATION: Bacteria, UA: NONE SEEN

## 2020-07-11 ENCOUNTER — Ambulatory Visit
Admission: RE | Admit: 2020-07-11 | Discharge: 2020-07-11 | Disposition: A | Discharge status: Home / Self Care | Payer: Self-pay | Source: Ambulatory Visit | Attending: Physician Assistant | Admitting: Physician Assistant

## 2020-07-11 ENCOUNTER — Other Ambulatory Visit: Payer: Self-pay

## 2020-07-11 DIAGNOSIS — R3129 Other microscopic hematuria: Secondary | ICD-10-CM | POA: Insufficient documentation

## 2020-07-12 ENCOUNTER — Other Ambulatory Visit: Payer: Self-pay | Admitting: Urology

## 2020-08-06 ENCOUNTER — Encounter: Payer: Self-pay | Admitting: Urology

## 2020-08-06 ENCOUNTER — Ambulatory Visit (INDEPENDENT_AMBULATORY_CARE_PROVIDER_SITE_OTHER): Payer: Self-pay | Admitting: Urology

## 2020-08-06 ENCOUNTER — Other Ambulatory Visit: Payer: Self-pay

## 2020-08-06 VITALS — BP 154/95 | HR 80 | Ht 70.0 in | Wt 257.0 lb

## 2020-08-06 DIAGNOSIS — N210 Calculus in bladder: Secondary | ICD-10-CM

## 2020-08-06 DIAGNOSIS — R3129 Other microscopic hematuria: Secondary | ICD-10-CM

## 2020-08-06 LAB — URINALYSIS, COMPLETE
Bilirubin, UA: NEGATIVE
Glucose, UA: NEGATIVE
Ketones, UA: NEGATIVE
Leukocytes,UA: NEGATIVE
Nitrite, UA: NEGATIVE
Protein,UA: NEGATIVE
Specific Gravity, UA: 1.025 (ref 1.005–1.030)
Urobilinogen, Ur: 0.2 mg/dL (ref 0.2–1.0)
pH, UA: 6 (ref 5.0–7.5)

## 2020-08-06 LAB — MICROSCOPIC EXAMINATION: Bacteria, UA: NONE SEEN

## 2020-08-06 NOTE — Addendum Note (Signed)
Addended by: Levada Schilling on: 08/06/2020 09:11 AM   Modules accepted: Orders

## 2020-08-06 NOTE — Progress Notes (Signed)
08/06/2020 8:35 AM   Daniel Castillo [name] 1965/11/04 [id number]  Referring provider: Barbette Reichmann, MD [address] [facility] [place],  [place] [address]  Chief Complaint  Patient presents with   Cysto    HPI: 55 y.o. male with intermittent symptoms of frequency, urgency, dysuria and decreased stream.  Recent PA visit also noted to have microhematuria. CT scan was performed which showed a 2 cm bladder calculus.  There were also left renal cysts and a nonobstructing punctate right renal calculus  He was scheduled for cystoscopy this morning however due to CT findings and the need for cystolitholapaxy he does not desire to have the procedure performed today.  Presently he has no voiding complaints   PMH: Past Medical History:  Diagnosis Date   Kidney stones     Surgical History: Past Surgical History:  Procedure Laterality Date   URETEROSCOPY WITH HOLMIUM LASER LITHOTRIPSY      Home Medications:  Allergies as of 08/06/2020   No Known Allergies      Medication List        Accurate as of August 06, 2020  8:35 AM. If you have any questions, ask your nurse or doctor.          oxybutynin 10 MG 24 hr tablet Commonly known as: DITROPAN-XL TAKE 1 TABLET BY MOUTH EVERY DAY   valACYclovir 500 MG tablet Commonly known as: VALTREX TAKE 1 TABLET BY MOUTH TWICE A DAY FOR 3 DAYS AT FIRST SIGNS OF SYMPTOM OUTBREAK        Allergies: No Known Allergies  Family History: Family History  Problem Relation Age of Onset   Lung cancer Mother    Prostate cancer Neg Hx    Bladder Cancer Neg Hx    Kidney cancer Neg Hx     Social History:  reports that he has been smoking cigarettes. He has a 40.00 pack-year smoking history. He has never used smokeless tobacco. No history on file for alcohol use and drug use.   Physical Exam: BP (!) 154/95   Pulse 80   Ht 5\' 10"  (1.778 m)   Wt 257 lb (116.6 kg)   BMI 36.88 kg/m   Constitutional:  Alert and  oriented, No acute distress. HEENT: [place] AT, moist mucus membranes.  Trachea midline, no masses. Cardiovascular: No clubbing, cyanosis, or edema. Respiratory: Normal respiratory effort, no increased work of breathing. Skin: No rashes, bruises or suspicious lesions. Neurologic: Grossly intact, no focal deficits, moving all 4 extremities. Psychiatric: Normal mood and affect.   Pertinent Imaging: CT images were personally reviewed and interpreted   Assessment & Plan:    1.  Bladder calculus 2 cm bladder calculus We discussed this stone is too large to pass and he will need cystolitholapaxy.  Symptoms most likely secondary to intermittent obstruction of the bladder neck due to the stone We also discussed that prostate enlargement can lead to stone formation however he does not desire an outlet procedure since he typically does not have bothersome voiding symptoms.  Possibility of recurrent stone formation was discussed The procedure was discussed in detail including potential risks of bleeding, infection, irritative voiding symptoms and bladder injury. All questions were answered and he desires to proceed  2.  Microhematuria Most likely secondary to above We did discuss other possibilities including bladder tumor and he was agreeable to TURBT/bladder biopsy or treatment of any other abnormal findings at the time of cystolitholapaxy   , MD  [facility]  [address] [place], [place] [address] [phone]

## 2020-08-08 LAB — CULTURE, URINE COMPREHENSIVE

## 2020-08-10 ENCOUNTER — Other Ambulatory Visit: Payer: Self-pay | Admitting: Urology

## 2020-08-10 ENCOUNTER — Encounter: Payer: Self-pay | Admitting: *Deleted

## 2020-08-10 ENCOUNTER — Telehealth: Payer: Self-pay | Admitting: *Deleted

## 2020-08-10 MED ORDER — AMOXICILLIN 875 MG PO TABS: 875.0000 mg | ORAL_TABLET | Freq: Two times a day (BID) | ORAL | 0 refills | Status: AC

## 2020-08-10 NOTE — Telephone Encounter (Signed)
-----   Message from Riki Altes, MD sent at 08/10/2020  7:29 AM EDT ----- Urine culture grew a low level of strep.  5-day course of antibiotics sent to pharmacy.  I have given the scheduling sheet to [facility] and he should be getting a call to schedule the bladder stone surgery

## 2020-08-10 NOTE — Telephone Encounter (Signed)
Patient notified

## 2020-08-13 ENCOUNTER — Telehealth: Payer: Self-pay | Admitting: *Deleted

## 2020-08-13 NOTE — Telephone Encounter (Signed)
-----   Message from Scott C Stoioff, MD sent at 08/10/2020  7:29 AM EDT ----- Urine culture grew a low level of strep.  5-day course of antibiotics sent to pharmacy.  I have given the scheduling sheet to Leah and he should be getting a call to schedule the bladder stone surgery 

## 2020-08-13 NOTE — Telephone Encounter (Signed)
Notified patient as instructed, patient pleased °

## 2020-08-14 ENCOUNTER — Telehealth: Payer: Self-pay

## 2020-08-14 ENCOUNTER — Other Ambulatory Visit: Payer: Self-pay

## 2020-08-14 DIAGNOSIS — N210 Calculus in bladder: Secondary | ICD-10-CM

## 2020-08-14 NOTE — Telephone Encounter (Signed)
Called patient back and confirmed surgery scheduled for 09/18/20. Pre-op urine culture lab drop off scheduled for 09/06/20. Patient has no cardiac hx and confirmed he is not taking any anticoagulants. If was instructed not to take any OTC NSAIDs 7 days prior to surgery. Surgery date and reminder form was mailed to patient.    Patient confirmed on antibiotic pill bottle that the Medication was Amoxicillin 875mg  2 tablets daily # 10 for 5 days. He reports taking two tablets daily for the past 4 days, still with 10 pills remaining. He was instructed to only take one more day to complete his course and to contact his pharmacy to inform them of this mistake and direction on medication disposal. Patient verbalized understanding.

## 2020-08-14 NOTE — Telephone Encounter (Signed)
Contacted patient to discuss scheduling surgery w/ Dr. Lonna Cobb, Cystolitholapaxy. Patient states he will review his calendar and call back tomorrow with date confirmation

## 2020-08-14 NOTE — Telephone Encounter (Signed)
Pt returned your call and says 8/23 will work for him.  He wants you to give him a call.  He also said the abx he is on, the pharmacy gave him too many pills.  He has finished and there are still 10 left.

## 2020-08-24 NOTE — Telephone Encounter (Signed)
Spoke with patient surgery date was rescheduled to 09/25/20, pre-op ua drop off apt was changed to 09/13/20. Patient verbalized understanding

## 2020-08-30 ENCOUNTER — Other Ambulatory Visit: Payer: Self-pay

## 2020-09-06 ENCOUNTER — Other Ambulatory Visit: Payer: Self-pay

## 2020-09-11 ENCOUNTER — Other Ambulatory Visit: Payer: Self-pay

## 2020-09-11 ENCOUNTER — Encounter
Admission: RE | Admit: 2020-09-11 | Discharge: 2020-09-11 | Disposition: A | Discharge status: Home / Self Care | Payer: Self-pay | Source: Ambulatory Visit | Attending: Urology | Admitting: Urology

## 2020-09-11 HISTORY — DX: Benign prostatic hyperplasia without lower urinary tract symptoms: N40.0

## 2020-09-11 HISTORY — DX: Induration penis plastica: N48.6

## 2020-09-11 HISTORY — DX: Herpesviral infection of urogenital system, unspecified: A60.00

## 2020-09-11 NOTE — Patient Instructions (Addendum)
Your procedure is scheduled on: September 25, 2020 TUESDAY Report to the Registration Desk on the 1st floor of the [facility]. To find out your arrival time, please call [phone] between 1PM - 3PM on: Monday September 24, 2020  REMEMBER: Instructions that are not followed completely may result in serious medical risk, up to and including death; or upon the discretion of your surgeon and anesthesiologist your surgery may need to be rescheduled.  DO NOT EAT OR DRINK after midnight the night before surgery.  No gum chewing, lozengers or hard candies.  TAKE THESE MEDICATIONS THE MORNING OF SURGERY WITH A SIP OF WATER: NONE  One week prior to surgery: Stop Anti-inflammatories (NSAIDS) such as Advil, Aleve, Ibuprofen, Motrin, Naproxen, Naprosyn and ASPIRIN OR Aspirin based products such as Excedrin, Goodys Powder, BC Powder. Stop ANY OVER THE COUNTER supplements until after surgery. You may however, continue to take Tylenol if needed for pain up until the day of surgery.  No Alcohol for 24 hours before or after surgery.  No Smoking including e-cigarettes for 24 hours prior to surgery.  No chewable tobacco products for at least 6 hours prior to surgery.  No nicotine patches on the day of surgery.  Do not use any "recreational" drugs for at least a week prior to your surgery.  Please be advised that the combination of cocaine and anesthesia may have negative outcomes, up to and including death. If you test positive for cocaine, your surgery will be cancelled.  On the morning of surgery brush your teeth with toothpaste and water, you may rinse your mouth with mouthwash if you wish. Do not swallow any toothpaste or mouthwash.  Do not wear jewelry, make-up, hairpins, clips or nail polish.  Do not wear lotions, powders, or perfumes OR DEODORANT   Do not shave body from the neck down 48 hours prior to surgery just in case you cut yourself which could leave a site for infection.  Also,  freshly shaved skin may become irritated if using the CHG soap.  Contact lenses, hearing aids and dentures may not be worn into surgery.  Do not bring valuables to the hospital. [facility] is not responsible for any missing/lost belongings or valuables.   SHOWER THE MORNING OF SURGERY  Notify your doctor if there is any change in your medical condition (cold, fever, infection).  Wear comfortable clothing (specific to your surgery type) to the hospital.  After surgery, you can help prevent lung complications by doing breathing exercises.  Take deep breaths and cough every 1-2 hours. Your doctor may order a device called an Incentive Spirometer to help you take deep breaths. When coughing or sneezing, hold a pillow firmly against your incision with both hands. This is called "splinting." Doing this helps protect your incision. It also decreases belly discomfort.  If you are being discharged the day of surgery, you will not be allowed to drive home. You will need a responsible adult (18 years or older) to drive you home and stay with you that night.   If you are taking public transportation, you will need to have a responsible adult (18 years or older) with you. Please confirm with your physician that it is acceptable to use public transportation.   Please call the Pre-admissions Testing Dept. at [phone] if you have any questions about these instructions.  Surgery Visitation Policy:  Patients undergoing a surgery or procedure may have one family member or support person with them as long as that  person is not COVID-19 positive or experiencing its symptoms.  That person may remain in the waiting area during the procedure.  Inpatient Visitation:    Visiting hours are 7 a.m. to 8 p.m. Inpatients will be allowed two visitors daily. The visitors may change each day during the patient's stay. No visitors under the age of 26. Any visitor under the age of 18 must be accompanied by an  adult. The visitor must pass COVID-19 screenings, use hand sanitizer when entering and exiting the patient's room and wear a mask at all times, including in the patient's room. Patients must also wear a mask when staff or their visitor are in the room. Masking is required regardless of vaccination status.

## 2020-09-13 ENCOUNTER — Other Ambulatory Visit (INDEPENDENT_AMBULATORY_CARE_PROVIDER_SITE_OTHER): Payer: Self-pay

## 2020-09-13 ENCOUNTER — Other Ambulatory Visit: Payer: Self-pay

## 2020-09-13 DIAGNOSIS — N210 Calculus in bladder: Secondary | ICD-10-CM

## 2020-09-14 LAB — URINALYSIS, COMPLETE
Bilirubin, UA: NEGATIVE
Glucose, UA: NEGATIVE
Leukocytes,UA: NEGATIVE
Nitrite, UA: NEGATIVE
Protein,UA: NEGATIVE
Specific Gravity, UA: 1.025 (ref 1.005–1.030)
Urobilinogen, Ur: 0.2 mg/dL (ref 0.2–1.0)
pH, UA: 5.5 (ref 5.0–7.5)

## 2020-09-14 LAB — MICROSCOPIC EXAMINATION
Bacteria, UA: NONE SEEN
WBC, UA: NONE SEEN /hpf (ref 0–5)

## 2020-09-16 LAB — CULTURE, URINE COMPREHENSIVE

## 2020-09-24 MED ORDER — CEFAZOLIN SODIUM-DEXTROSE 2-4 GM/100ML-% IV SOLN
2.0000 g | INTRAVENOUS | Status: AC
  Administered 2020-09-25: 2 g via INTRAVENOUS

## 2020-09-24 MED ORDER — FAMOTIDINE 20 MG PO TABS: 20.0000 mg | ORAL_TABLET | Freq: Once | ORAL | Status: AC

## 2020-09-25 ENCOUNTER — Ambulatory Visit
Admission: RE | Admit: 2020-09-25 | Discharge: 2020-09-25 | Disposition: A | Discharge status: Home / Self Care | Payer: Self-pay | Attending: Urology | Admitting: Urology

## 2020-09-25 ENCOUNTER — Ambulatory Visit: Payer: Self-pay | Admitting: Certified Registered"

## 2020-09-25 ENCOUNTER — Ambulatory Visit: Payer: Self-pay

## 2020-09-25 ENCOUNTER — Encounter
Admission: RE | Disposition: A | Discharge status: Home / Self Care | Payer: Self-pay | Source: Home / Self Care | Attending: Urology

## 2020-09-25 ENCOUNTER — Encounter: Payer: Self-pay | Admitting: Urology

## 2020-09-25 DIAGNOSIS — R3912 Poor urinary stream: Secondary | ICD-10-CM | POA: Insufficient documentation

## 2020-09-25 DIAGNOSIS — R3129 Other microscopic hematuria: Secondary | ICD-10-CM | POA: Insufficient documentation

## 2020-09-25 DIAGNOSIS — N210 Calculus in bladder: Secondary | ICD-10-CM | POA: Insufficient documentation

## 2020-09-25 DIAGNOSIS — F1290 Cannabis use, unspecified, uncomplicated: Secondary | ICD-10-CM | POA: Insufficient documentation

## 2020-09-25 DIAGNOSIS — Z8619 Personal history of other infectious and parasitic diseases: Secondary | ICD-10-CM | POA: Insufficient documentation

## 2020-09-25 DIAGNOSIS — R3915 Urgency of urination: Secondary | ICD-10-CM | POA: Insufficient documentation

## 2020-09-25 DIAGNOSIS — N401 Enlarged prostate with lower urinary tract symptoms: Secondary | ICD-10-CM | POA: Insufficient documentation

## 2020-09-25 DIAGNOSIS — Z87442 Personal history of urinary calculi: Secondary | ICD-10-CM | POA: Insufficient documentation

## 2020-09-25 DIAGNOSIS — F17200 Nicotine dependence, unspecified, uncomplicated: Secondary | ICD-10-CM | POA: Insufficient documentation

## 2020-09-25 HISTORY — PX: CYSTOSCOPY WITH LITHOLAPAXY: SHX1425

## 2020-09-25 LAB — URINE DRUG SCREEN, QUALITATIVE (ARMC ONLY)
Amphetamines, Ur Screen: NOT DETECTED
Barbiturates, Ur Screen: NOT DETECTED
Benzodiazepine, Ur Scrn: NOT DETECTED
Cannabinoid 50 Ng, Ur ~~LOC~~: POSITIVE — AB
Cocaine Metabolite,Ur ~~LOC~~: NOT DETECTED
MDMA (Ecstasy)Ur Screen: NOT DETECTED
Methadone Scn, Ur: NOT DETECTED
Opiate, Ur Screen: NOT DETECTED
Phencyclidine (PCP) Ur S: NOT DETECTED
Tricyclic, Ur Screen: NOT DETECTED

## 2020-09-25 SURGERY — CYSTOSCOPY, WITH BLADDER CALCULUS LITHOLAPAXY
Anesthesia: General

## 2020-09-25 MED ORDER — SEVOFLURANE IN SOLN
RESPIRATORY_TRACT | Status: AC
  Filled 2020-09-25: qty 250

## 2020-09-25 MED ORDER — FENTANYL CITRATE (PF) 100 MCG/2ML IJ SOLN
INTRAMUSCULAR | Status: AC
  Filled 2020-09-25: qty 2

## 2020-09-25 MED ORDER — CHLORHEXIDINE GLUCONATE 0.12 % MT SOLN: 15.0000 mL | Freq: Once | OROMUCOSAL | Status: AC

## 2020-09-25 MED ORDER — MIDAZOLAM HCL 2 MG/2ML IJ SOLN
INTRAMUSCULAR | Status: AC
  Filled 2020-09-25: qty 2

## 2020-09-25 MED ORDER — BELLADONNA ALKALOIDS-OPIUM 16.2-60 MG RE SUPP
RECTAL | Status: DC | PRN
  Administered 2020-09-25: 1 via RECTAL

## 2020-09-25 MED ORDER — FAMOTIDINE 20 MG PO TABS
ORAL_TABLET | ORAL | Status: AC
  Administered 2020-09-25: 20 mg via ORAL
  Filled 2020-09-25: qty 1

## 2020-09-25 MED ORDER — LACTATED RINGERS IV SOLN: INTRAVENOUS | Status: DC

## 2020-09-25 MED ORDER — ORAL CARE MOUTH RINSE: 15.0000 mL | Freq: Once | OROMUCOSAL | Status: AC

## 2020-09-25 MED ORDER — PROPOFOL 10 MG/ML IV BOLUS
INTRAVENOUS | Status: AC
  Filled 2020-09-25: qty 20

## 2020-09-25 MED ORDER — DEXMEDETOMIDINE (PRECEDEX) IN NS 20 MCG/5ML (4 MCG/ML) IV SYRINGE
PREFILLED_SYRINGE | INTRAVENOUS | Status: DC | PRN
  Administered 2020-09-25: 12 ug via INTRAVENOUS
  Administered 2020-09-25: 8 ug via INTRAVENOUS

## 2020-09-25 MED ORDER — GLYCOPYRROLATE 0.2 MG/ML IJ SOLN
INTRAMUSCULAR | Status: DC | PRN
  Administered 2020-09-25: .2 mg via INTRAVENOUS

## 2020-09-25 MED ORDER — CHLORHEXIDINE GLUCONATE 0.12 % MT SOLN
OROMUCOSAL | Status: AC
  Administered 2020-09-25: 15 mL via OROMUCOSAL
  Filled 2020-09-25: qty 15

## 2020-09-25 MED ORDER — BELLADONNA ALKALOIDS-OPIUM 16.2-60 MG RE SUPP
RECTAL | Status: AC
  Filled 2020-09-25: qty 1

## 2020-09-25 MED ORDER — 0.9 % SODIUM CHLORIDE (POUR BTL) OPTIME
TOPICAL | Status: DC | PRN
  Administered 2020-09-25: 8000 mL

## 2020-09-25 MED ORDER — URIBEL 118 MG PO CAPS: 1.0000 | ORAL_CAPSULE | Freq: Three times a day (TID) | ORAL | 0 refills | Status: AC | PRN

## 2020-09-25 MED ORDER — ONDANSETRON HCL 4 MG/2ML IJ SOLN
INTRAMUSCULAR | Status: DC | PRN
  Administered 2020-09-25: 4 mg via INTRAVENOUS

## 2020-09-25 MED ORDER — LIDOCAINE HCL (CARDIAC) PF 100 MG/5ML IV SOSY
PREFILLED_SYRINGE | INTRAVENOUS | Status: DC | PRN
  Administered 2020-09-25: 100 mg via INTRAVENOUS

## 2020-09-25 MED ORDER — DEXAMETHASONE SODIUM PHOSPHATE 10 MG/ML IJ SOLN
INTRAMUSCULAR | Status: DC | PRN
  Administered 2020-09-25: 8 mg via INTRAVENOUS

## 2020-09-25 MED ORDER — PROPOFOL 10 MG/ML IV BOLUS
INTRAVENOUS | Status: DC | PRN
  Administered 2020-09-25: 200 mg via INTRAVENOUS

## 2020-09-25 MED ORDER — PHENYLEPHRINE HCL (PRESSORS) 10 MG/ML IV SOLN
INTRAVENOUS | Status: DC | PRN
  Administered 2020-09-25: 100 ug via INTRAVENOUS

## 2020-09-25 MED ORDER — PHENYLEPHRINE HCL (PRESSORS) 10 MG/ML IV SOLN
INTRAVENOUS | Status: AC
  Filled 2020-09-25: qty 1

## 2020-09-25 MED ORDER — FENTANYL CITRATE (PF) 100 MCG/2ML IJ SOLN
INTRAMUSCULAR | Status: DC | PRN
  Administered 2020-09-25: 50 ug via INTRAVENOUS

## 2020-09-25 MED ORDER — CEFAZOLIN SODIUM-DEXTROSE 2-4 GM/100ML-% IV SOLN
INTRAVENOUS | Status: AC
  Filled 2020-09-25: qty 100

## 2020-09-25 SURGICAL SUPPLY — 26 items
BAG DRAIN CYSTO-URO LG1000N (MISCELLANEOUS) ×2 IMPLANT
BAG DRN RND TRDRP ANRFLXCHMBR (UROLOGICAL SUPPLIES)
BAG URINE DRAIN 2000ML AR STRL (UROLOGICAL SUPPLIES) IMPLANT
BASKET ZERO TIP 1.9FR (BASKET) IMPLANT
BSKT STON RTRVL ZERO TP 1.9FR (BASKET)
CATH FOL 2WAY LX 18X30 (CATHETERS) IMPLANT
FIBER LASER FLEXIVA PULSE 550 (Laser) ×2 IMPLANT
FIBER LASER FLEXIVA PULSE 910 (Laser) IMPLANT
GAUZE 4X4 16PLY ~~LOC~~+RFID DBL (SPONGE) ×4 IMPLANT
GLOVE SURG UNDER POLY LF SZ7.5 (GLOVE) ×2 IMPLANT
GOWN STRL REUS W/ TWL LRG LVL3 (GOWN DISPOSABLE) ×2 IMPLANT
GOWN STRL REUS W/ TWL XL LVL3 (GOWN DISPOSABLE) ×1 IMPLANT
GOWN STRL REUS W/TWL LRG LVL3 (GOWN DISPOSABLE) ×4
GOWN STRL REUS W/TWL XL LVL3 (GOWN DISPOSABLE) ×2
IV NS IRRIG 3000ML ARTHROMATIC (IV SOLUTION) ×4 IMPLANT
KIT PROBE TRILOGY 3.9X350 (MISCELLANEOUS) IMPLANT
KIT TURNOVER CYSTO (KITS) ×2 IMPLANT
MANIFOLD NEPTUNE II (INSTRUMENTS) ×2 IMPLANT
PACK CYSTO AR (MISCELLANEOUS) ×2 IMPLANT
SET IRRIG Y TYPE TUR BLADDER L (SET/KITS/TRAYS/PACK) ×2 IMPLANT
SURGILUBE 2OZ TUBE FLIPTOP (MISCELLANEOUS) ×2 IMPLANT
SYR TOOMEY IRRIG 70ML (MISCELLANEOUS) ×2
SYRINGE TOOMEY IRRIG 70ML (MISCELLANEOUS) ×1 IMPLANT
WATER STERILE IRR 1000ML POUR (IV SOLUTION) ×2 IMPLANT
WATER STERILE IRR 3000ML UROMA (IV SOLUTION) IMPLANT
WATER STERILE IRR 500ML POUR (IV SOLUTION) ×2 IMPLANT

## 2020-09-25 NOTE — Interval H&P Note (Signed)
History and Physical Interval Note:  09/25/2020 9:01 AM  Daniel Castillo  has presented today for surgery, with the diagnosis of Bladder Calculus.  The various methods of treatment have been discussed with the patient and family. After consideration of risks, benefits and other options for treatment, the patient has consented to  Procedure(s): CYSTOSCOPY WITH LITHOLAPAXY (N/A) as a surgical intervention.  The patient's history has been reviewed, patient examined, no change in status, stable for surgery.  I have reviewed the patient's chart and labs.  Questions were answered to the patient's satisfaction.     Jamieson Hetland C Abenezer Odonell

## 2020-09-25 NOTE — Discharge Instructions (Addendum)
Cystoscopy/bladder stone patient instructions  Following a cystoscopy, a catheter (a flexible rubber tube) is sometimes left in place to empty the bladder. This may cause some discomfort or a feeling that you need to urinate. Your doctor determines the period of time that the catheter will be left in place. You may have bloody urine for two to three days (Call your doctor if the amount of bleeding increases or does not subside).  You may pass blood clots in your urine, especially if you had a biopsy. It is not unusual to pass small blood clots and have some bloody urine a couple of weeks after your cystoscopy. Again, call your doctor if the bleeding does not subside. You may have: Dysuria (painful urination) Frequency (urinating often) Urgency (strong desire to urinate)  Over-the-counter AZO for burning may help the symptoms.  A prescription was sent to your pharmacy to help with burning, frequency and urgency if needed.  These symptoms are common. Avoiding alcohol and caffeine, such as coffee, tea, and chocolate, may help relieve these symptoms. Drink plenty of water, unless otherwise instructed. Your doctor may also prescribe an antibiotic or other medicine to reduce these symptoms.  Special Instructions:   If you are going home with a catheter in place do not take a tub bath until removed by your doctor.   You may resume your normal activities.   Do not drive or operate machinery if you are taking narcotic pain medicine.   Be sure to keep all follow-up appointments with your doctor.   Call Your Doctor If: You have severe pain You are unable to urinate You have a fever over 101 You have severe bleeding  Follow-up: You are scheduled for a postop follow-up on 10/10/2020         AMBULATORY SURGERY  DISCHARGE INSTRUCTIONS   The drugs that you were given will stay in your system until tomorrow so for the next 24 hours you should not:  Drive an automobile Make any legal  decisions Drink any alcoholic beverage   You may resume regular meals tomorrow.  Today it is better to start with liquids and gradually work up to solid foods.  You may eat anything you prefer, but it is better to start with liquids, then soup and crackers, and gradually work up to solid foods.   Please notify your doctor immediately if you have any unusual bleeding, trouble breathing, redness and pain at the surgery site, drainage, fever, or pain not relieved by medication.    Additional Instructions:        Please contact your physician with any problems or Same Day Surgery at [phone], Monday through Friday 6 am to 4 pm, or [place] [facility] number at [phone].

## 2020-09-25 NOTE — Transfer of Care (Signed)
Immediate Anesthesia Transfer of Care Note  Patient: Daniel Castillo  Procedure(s) Performed: CYSTOSCOPY WITH LITHOLAPAXY  Patient Location: PACU  Anesthesia Type:General  Level of Consciousness: drowsy  Airway & Oxygen Therapy: Patient Spontanous Breathing and Patient connected to face mask oxygen  Post-op Assessment: Report given to RN  Post vital signs: stable  Last Vitals:  Vitals Value Taken Time  BP 109/71 09/25/20 1000  Temp    Pulse 72 09/25/20 0959  Resp 20 09/25/20 0959  SpO2 98 % 09/25/20 0959  Vitals shown include unvalidated device data.  Last Pain:  Vitals:   09/25/20 0714  TempSrc: Temporal  PainSc: 0-No pain         Complications: No notable events documented.

## 2020-09-25 NOTE — H&P (Signed)
Urology H&P  Chief Complaint: Bladder stone  History of Present Illness: Daniel Castillo is a 55 y.o. with a several month history of intermittent frequency, urgency, dysuria, decreased stream and microhematuria.  CT urogram remarkable for no upper tract abnormalities and a 2 cm bladder calculus.  He presents today for cystoscopy under anesthesia and cystolitholapaxy.  Past Medical History:  Diagnosis Date   Benign prostate hyperplasia    Genital herpes    Kidney stones    Peyronie's disease     Past Surgical History:  Procedure Laterality Date   URETEROSCOPY WITH HOLMIUM LASER LITHOTRIPSY      Home Medications:  Current Meds  Medication Sig   ibuprofen (ADVIL) 200 MG tablet Take 200-400 mg by mouth every 6 (six) hours as needed for moderate pain.    Allergies: No Known Allergies  Family History  Problem Relation Age of Onset   Lung cancer Mother    Prostate cancer Neg Hx    Bladder Cancer Neg Hx    Kidney cancer Neg Hx     Social History:  reports that he has been smoking cigarettes. He has a 40.00 pack-year smoking history. He has never used smokeless tobacco. He reports that he does not currently use alcohol. He reports current drug use. Drug: Marijuana.  ROS: A complete review of systems was performed.  All systems are negative except for pertinent findings as noted.  Physical Exam:  Vital signs in last 24 hours:   Constitutional:  Alert and oriented, No acute distress HEENT: [place] AT, moist mucus membranes.  Trachea midline, no masses Cardiovascular: Regular rate and rhythm Respiratory: Normal respiratory effort, lungs clear bilaterally GI: Abdomen is soft, nontender, nondistended, no abdominal masses GU: No CVA tenderness Skin: No rashes, bruises or suspicious lesions Lymph: No cervical or inguinal adenopathy Neurologic: Grossly intact, no focal deficits, moving all 4 extremities Psychiatric: Normal mood and affect   Laboratory Data:  No results for  input(s): WBC, HGB, HCT in the last 72 hours. No results for input(s): NA, K, CL, CO2, GLUCOSE, BUN, CREATININE, CALCIUM in the last 72 hours. No results for input(s): LABPT, INR in the last 72 hours. No results for input(s): LABURIN in the last 72 hours. Results for orders placed or performed in visit on 09/13/20  CULTURE, URINE COMPREHENSIVE     Status: None   Collection Time: 09/13/20  2:52 PM   Specimen: Urine   Urine  Result Value Ref Range Status   Urine Culture, Comprehensive Final report  Final   Organism ID, Bacteria Comment  Final    Comment: No growth in 36 - 48 hours.  Microscopic Examination     Status: Abnormal   Collection Time: 09/13/20  2:52 PM   Urine  Result Value Ref Range Status   WBC, UA None seen 0 - 5 /hpf Final   RBC 3-10 (A) 0 - 2 /hpf Final   Epithelial Cells (non renal) 0-10 0 - 10 /hpf Final   Mucus, UA Present (A) Not Estab. Final   Bacteria, UA None seen None seen/Few Final    Impression/plan:  Bladder calculus He presents for cystolitholapaxy.  He has declined an outlet procedure. The procedure has been discussed in detail including potential risks of bleeding, infection and recurrent bladder stone formation Although his microhematuria is most likely secondary to bladder calculus he was agreeable to bladder biopsy/TURBT for any other findings identified on cystoscopy   09/25/2020, 7:12 AM  Irineo Axon,  MD

## 2020-09-25 NOTE — Anesthesia Preprocedure Evaluation (Addendum)
Anesthesia Evaluation  Patient identified by MRN, date of birth, ID band Patient awake    Reviewed: Allergy & Precautions, H&P , NPO status , Patient's Chart, lab work & pertinent test results  Airway Mallampati: III  TM Distance: >3 FB Neck ROM: full    Dental no notable dental hx.    Pulmonary Current SmokerPatient did not abstain from smoking.,    Pulmonary exam normal        Cardiovascular negative cardio ROS Normal cardiovascular exam     Neuro/Psych negative neurological ROS  negative psych ROS   GI/Hepatic negative GI ROS, Neg liver ROS,   Endo/Other  negative endocrine ROS  Renal/GU Kidney Stone     Musculoskeletal   Abdominal (+) + obese,   Peds  Hematology negative hematology ROS (+)   Anesthesia Other Findings Past Medical History: No date: Benign prostate hyperplasia No date: Genital herpes No date: Kidney stones No date: Peyronie's disease  Past Surgical History: No date: URETEROSCOPY WITH HOLMIUM LASER LITHOTRIPSY  BMI    Body Mass Index: 36.88 kg/m      Reproductive/Obstetrics negative OB ROS                           Anesthesia Physical Anesthesia Plan  ASA: 3  Anesthesia Plan: General ETT   Post-op Pain Management:    Induction:   PONV Risk Score and Plan: 1 and Ondansetron and Dexamethasone  Airway Management Planned:   Additional Equipment:   Intra-op Plan:   Post-operative Plan:   Informed Consent: I have reviewed the patients History and Physical, chart, labs and discussed the procedure including the risks, benefits and alternatives for the proposed anesthesia with the patient or authorized representative who has indicated his/her understanding and acceptance.     Dental Advisory Given  Plan Discussed with: Anesthesiologist, CRNA and Surgeon  Anesthesia Plan Comments:         Anesthesia Quick Evaluation

## 2020-09-25 NOTE — Anesthesia Postprocedure Evaluation (Signed)
Anesthesia Post Note  Patient: Daniel Castillo  Procedure(s) Performed: CYSTOSCOPY WITH LITHOLAPAXY  Patient location during evaluation: PACU Anesthesia Type: General Level of consciousness: awake and alert Pain management: pain level controlled Vital Signs Assessment: post-procedure vital signs reviewed and stable Respiratory status: spontaneous breathing, nonlabored ventilation and respiratory function stable Cardiovascular status: blood pressure returned to baseline and stable Postop Assessment: no apparent nausea or vomiting Anesthetic complications: no   No notable events documented.   Last Vitals:  Vitals:   09/25/20 1059 09/25/20 1100  BP:  140/62  Pulse: 70   Resp: 15   Temp: (!) 36.3 C   SpO2: 99%     Last Pain:  Vitals:   09/25/20 1059  TempSrc: Temporal  PainSc: 0-No pain                 Foye Deer

## 2020-09-25 NOTE — Anesthesia Procedure Notes (Signed)
Procedure Name: LMA Insertion Date/Time: 09/25/2020 8:54 AM Performed by: Jaye Beagle, CRNA Pre-anesthesia Checklist: Patient identified, Emergency Drugs available, Suction available and Patient being monitored Patient Re-evaluated:Patient Re-evaluated prior to induction Oxygen Delivery Method: Circle system utilized Preoxygenation: Pre-oxygenation with 100% oxygen Induction Type: IV induction Ventilation: Mask ventilation without difficulty LMA: LMA inserted LMA Size: 4.5 Number of attempts: 1 Tube secured with: Tape Dental Injury: Teeth and Oropharynx as per pre-operative assessment

## 2020-09-25 NOTE — Op Note (Signed)
Preoperative diagnosis:  Bladder calculus (<2.5 cm)  Postoperative diagnosis:  Same  Procedure: Cystolitholapaxy  Surgeon: Riki Altes, MD  Anesthesia: General  Complications: None  Intraoperative findings:  1.  Cystoscopy: Urethra normal in caliber without stricture.  Prostate without lateral lobe enlargement and normal bladder neck.  Bladder mucosa without solid or papillary lesions.  UOs normal-appearing bilaterally with clear efflux.  ~ 2 cm bladder calculus at bladder base with mild mucosal erythema lower posterior wall  EBL: Minimal  Specimens: None  Indication: Daniel Castillo is a 55 y.o. patient with storage related voiding symptoms and microhematuria.  CT urogram showed no upper tract abnormalities and a 2 cm bladder calculus.  He declined office cystoscopy and is scheduled for cystoscopy under anesthesia and cystolitholapaxy.  After reviewing the management options for treatment, he elected to proceed with the above surgical procedure(s). We have discussed the potential benefits and risks of the procedure, side effects of the proposed treatment, the likelihood of the patient achieving the goals of the procedure, and any potential problems that might occur during the procedure or recuperation. Informed consent has been obtained.  Description of procedure:  The patient was taken to the operating room and general anesthesia was induced.  The patient was placed in the dorsal lithotomy position, prepped and draped in the usual sterile fashion, and preoperative antibiotics were administered. A preoperative time-out was performed.   A 24 French resectoscope sheath with obturator was lubricated and passed per urethra into the bladder without difficulty.  A 30 degree lens with laser bridge was then placed into the sheath and panendoscopy was performed with findings as described above.  A 500 m holmium laser fiber was then placed through the laser bridge.  The stone was dusted  at initial settings of 0.3/50 and subsequently increased to 0.6/50.  The last portion of the calculus was fragmented and the larger pieces at 1.0/50.  All fragments/particles were then removed via irrigation and fill/drain.  At the completion of procedure with exception of minimal remaining stone pattern no fragments were identified.  No bleeding was noted.  A Foley catheter was not placed.  After anesthetic reversal he was transported to the PACU in stable condition.  Plan: Postop follow-up scheduled 10/10/2020   Riki Altes, M.D.

## 2020-10-03 ENCOUNTER — Encounter: Payer: Self-pay | Admitting: Urology

## 2020-10-09 NOTE — Progress Notes (Signed)
10/10/2020 4:31 PM   [name] November 11, 1965 [id number]  Referring provider: Barbette Reichmann, MD [address] [facility] [place],  [place] [address]  Urological history: 1. Peyronie's disease -< 30 degree curvature  2. ED -contributing factors of age, BPH, smoking and HTN  3. Genital herpes  4. High risk hematuria -smoker -CT renal stone study 06/2020 - 2 cm urinary bladder stone.  One right and two left nonobstructing renal stones. Largest stone is in the left kidney and measures 0.6 cm.  Chief Complaint  Patient presents with   Routine Post Op    HPI: Daniel Castillo is a 55 y.o. male who presents today for follow up.   He underwent cystolitholopaxy on September 25, 2020 for approximately 2 cm bladder calculus.  Postprocedural course was as expected and uneventful.  He states he is voiding well without any issues.  Patient denies any modifying or aggravating factors.  Patient denies any gross hematuria, dysuria or suprapubic/flank pain.  Patient denies any fevers, chills, nausea or vomiting.     PVR 27 mL      PMH: Past Medical History:  Diagnosis Date   Benign prostate hyperplasia    Genital herpes    Kidney stones    Peyronie's disease     Surgical History: Past Surgical History:  Procedure Laterality Date   CYSTOSCOPY WITH LITHOLAPAXY N/A 09/25/2020   Procedure: CYSTOSCOPY WITH LITHOLAPAXY;  Surgeon: Riki Altes, MD;  Location: [facility] ORS;  Service: Urology;  Laterality: N/A;   URETEROSCOPY WITH HOLMIUM LASER LITHOTRIPSY      Home Medications:  Allergies as of 10/10/2020   No Known Allergies      Medication List        Accurate as of October 10, 2020  4:31 PM. If you have any questions, ask your nurse or doctor.          ibuprofen 200 MG tablet Commonly known as: ADVIL Take 200-400 mg by mouth every 6 (six) hours as needed for moderate pain.   Uribel 118 MG Caps Take 1 capsule (118 mg total) by mouth 3  (three) times daily as needed (Urinary frequency, urgency, burning).        Allergies: No Known Allergies  Family History: Family History  Problem Relation Age of Onset   Lung cancer Mother    Prostate cancer Neg Hx    Bladder Cancer Neg Hx    Kidney cancer Neg Hx     Social History:  reports that he has been smoking cigarettes. He has a 40.00 pack-year smoking history. He has never used smokeless tobacco. He reports that he does not currently use alcohol. He reports current drug use. Drug: Marijuana.  ROS: Pertinent ROS in HPI  Physical Exam: BP (!) 149/81   Pulse 91   Ht 5\' 10"  (1.778 m)   Wt 256 lb (116.1 kg)   BMI 36.73 kg/m   Constitutional:  Well nourished. Alert and oriented, No acute distress. HEENT: [name] AT, mask in place.  Trachea midline Cardiovascular: No clubbing, cyanosis, or edema. Respiratory: Normal respiratory effort, no increased work of breathing. Neurologic: Grossly intact, no focal deficits, moving all 4 extremities. Psychiatric: Normal mood and affect.  Laboratory Data: N/A  Urinalysis    Component Value Date/Time   APPEARANCEUR Clear 09/13/2020 1452   GLUCOSEU Negative 09/13/2020 1452   BILIRUBINUR Negative 09/13/2020 1452   PROTEINUR Negative 09/13/2020 1452   NITRITE Negative 09/13/2020 1452   LEUKOCYTESUR Negative 09/13/2020 1452  I have reviewed the labs.   Pertinent Imaging: Results for Daniel Castillo, Daniel Castillo (MRN [id number]) as of 10/10/2020 16:28  Ref. Range 10/10/2020 15:30  Scan Result Unknown 3mL    Assessment & Plan:    1. Bladder calculus -s/p cystolitholapaxy -doing well  2. Hematuria -secondary to stones -recheck UA at next visit  3. Bilateral nephrolithiasis -KUB at next visit   Return for Keep follow up with Dr. Lonna Cobb for UA, KUB, PVR on 03/07/2021.  These notes generated with voice recognition software. I apologize for typographical errors.  Michiel Cowboy, PA-C  [facility] [address]  [address] [place], [place] [address] [phone]

## 2020-10-10 ENCOUNTER — Other Ambulatory Visit: Payer: Self-pay

## 2020-10-10 ENCOUNTER — Encounter: Payer: Self-pay | Admitting: Urology

## 2020-10-10 ENCOUNTER — Ambulatory Visit: Payer: Self-pay | Admitting: Urology

## 2020-10-10 ENCOUNTER — Ambulatory Visit (INDEPENDENT_AMBULATORY_CARE_PROVIDER_SITE_OTHER): Payer: Self-pay | Admitting: Urology

## 2020-10-10 VITALS — BP 149/81 | HR 91 | Ht 70.0 in | Wt 256.0 lb

## 2020-10-10 DIAGNOSIS — N200 Calculus of kidney: Secondary | ICD-10-CM

## 2020-10-10 DIAGNOSIS — R319 Hematuria, unspecified: Secondary | ICD-10-CM

## 2020-10-10 DIAGNOSIS — N210 Calculus in bladder: Secondary | ICD-10-CM

## 2020-10-10 LAB — BLADDER SCAN AMB NON-IMAGING

## 2021-03-05 ENCOUNTER — Other Ambulatory Visit: Payer: Self-pay | Admitting: *Deleted

## 2021-03-05 DIAGNOSIS — N200 Calculus of kidney: Secondary | ICD-10-CM

## 2021-03-05 DIAGNOSIS — N210 Calculus in bladder: Secondary | ICD-10-CM

## 2021-03-07 ENCOUNTER — Ambulatory Visit: Payer: Self-pay | Admitting: Urology

## 2021-06-28 ENCOUNTER — Other Ambulatory Visit: Payer: Self-pay | Admitting: *Deleted

## 2021-06-28 DIAGNOSIS — Z87891 Personal history of nicotine dependence: Secondary | ICD-10-CM

## 2021-06-28 DIAGNOSIS — Z122 Encounter for screening for malignant neoplasm of respiratory organs: Secondary | ICD-10-CM

## 2021-06-28 DIAGNOSIS — F17210 Nicotine dependence, cigarettes, uncomplicated: Secondary | ICD-10-CM

## 2021-07-08 ENCOUNTER — Ambulatory Visit
Admission: RE | Admit: 2021-07-08 | Discharge: 2021-07-08 | Disposition: A | Discharge status: Home / Self Care | Payer: Self-pay | Source: Ambulatory Visit | Attending: Acute Care | Admitting: Acute Care

## 2021-07-08 DIAGNOSIS — Z87891 Personal history of nicotine dependence: Secondary | ICD-10-CM | POA: Insufficient documentation

## 2021-07-08 DIAGNOSIS — F17210 Nicotine dependence, cigarettes, uncomplicated: Secondary | ICD-10-CM | POA: Insufficient documentation

## 2021-07-08 DIAGNOSIS — Z122 Encounter for screening for malignant neoplasm of respiratory organs: Secondary | ICD-10-CM | POA: Insufficient documentation

## 2021-07-11 ENCOUNTER — Other Ambulatory Visit: Payer: Self-pay | Admitting: Acute Care

## 2021-07-11 DIAGNOSIS — Z87891 Personal history of nicotine dependence: Secondary | ICD-10-CM

## 2021-07-11 DIAGNOSIS — Z122 Encounter for screening for malignant neoplasm of respiratory organs: Secondary | ICD-10-CM

## 2021-07-11 DIAGNOSIS — F17210 Nicotine dependence, cigarettes, uncomplicated: Secondary | ICD-10-CM

## 2022-06-21 IMAGING — CT CT CHEST LUNG CANCER SCREENING LOW DOSE W/O CM
2 of 5 series · 15 of 40 positions shown, 18 images · non-contrast
Comparison: 06/13/2020.

CLINICAL DATA: Current smoker, 40 pack-year history.



[Series 3: lung 1.00 · axial · 0.81mm/px · z∈[-1226,-914]mm · 12 of 344 slices shown, 15 images]
[im 16/344  mediastinal]
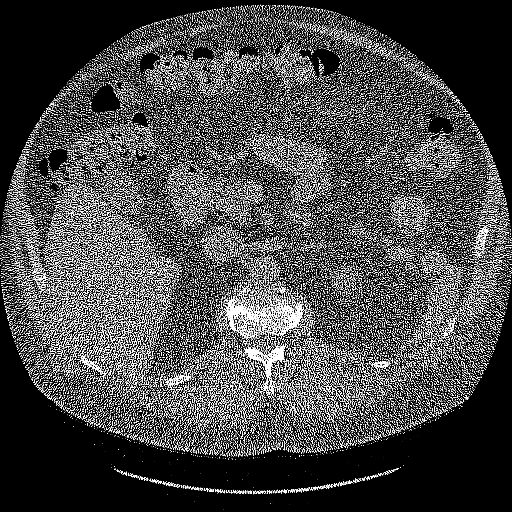
[im 16/344  lung]
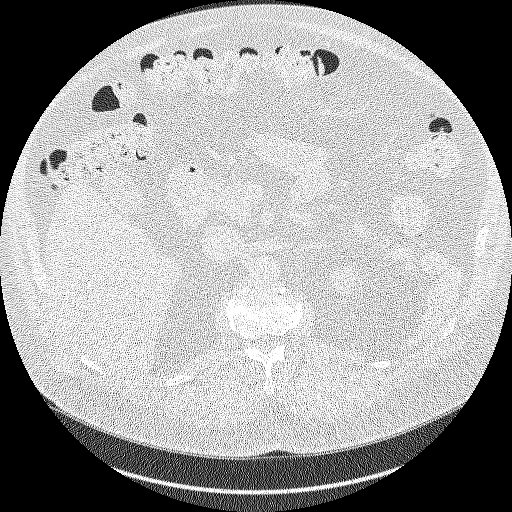
[im 47/344  lung]
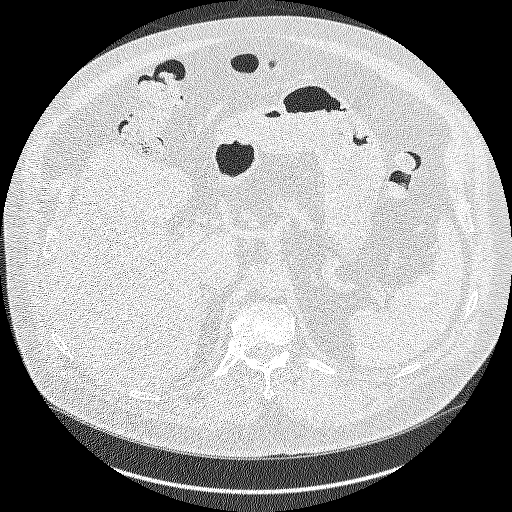
[im 78/344  lung]
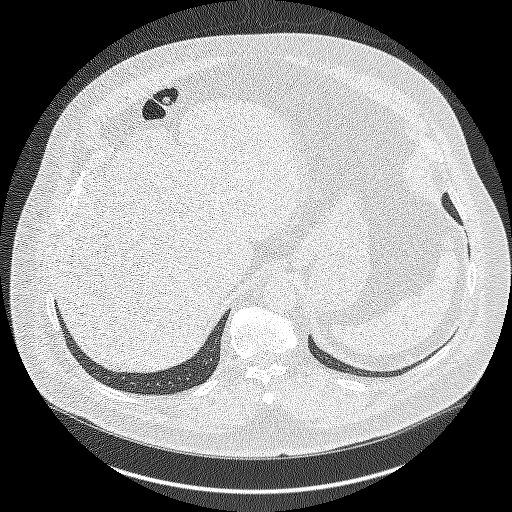
[im 110/344  lung]
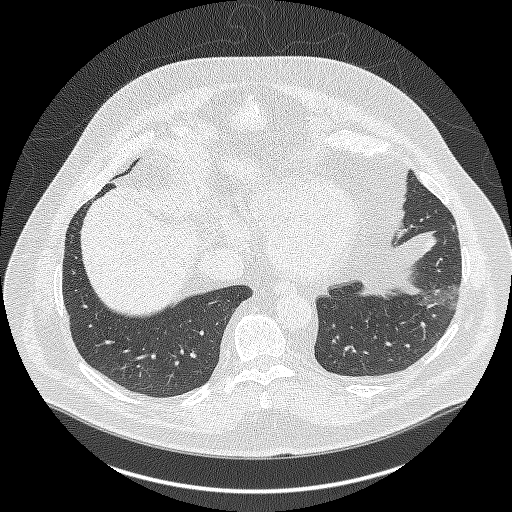
[im 125/344  mediastinal]
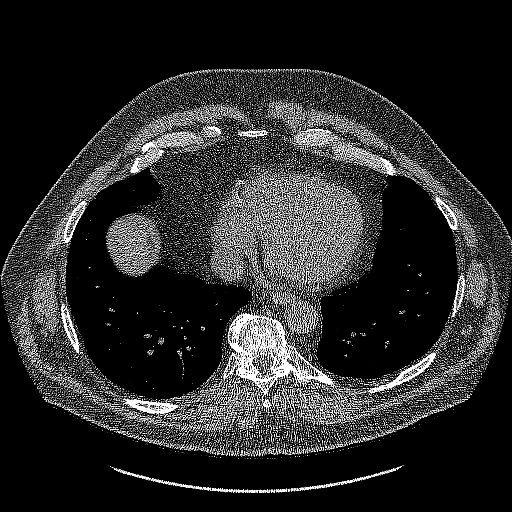
[im 125/344  lung]
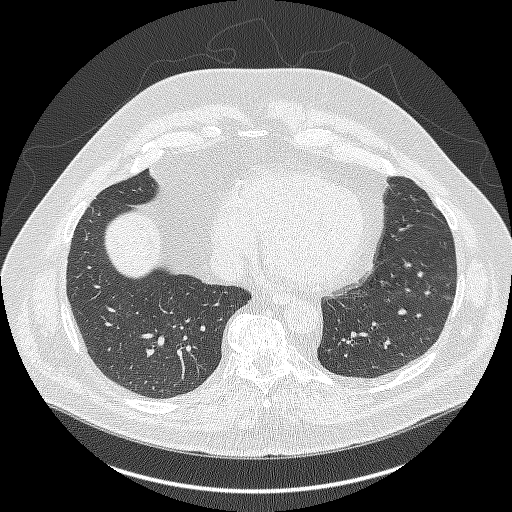
[im 156/344  lung]
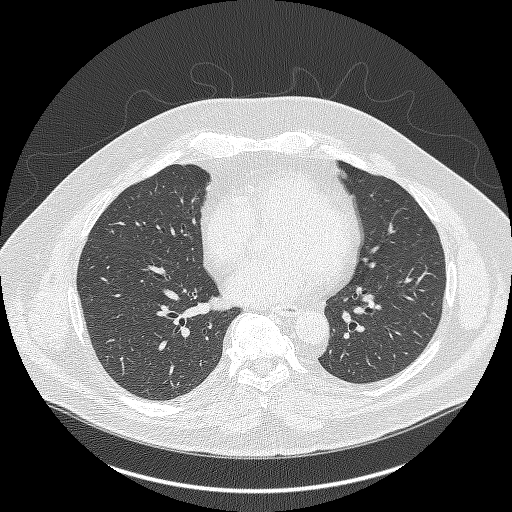
[im 188/344  lung]
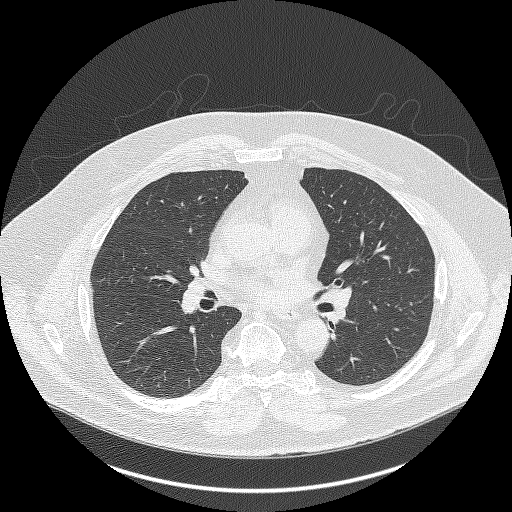
[im 219/344  lung]
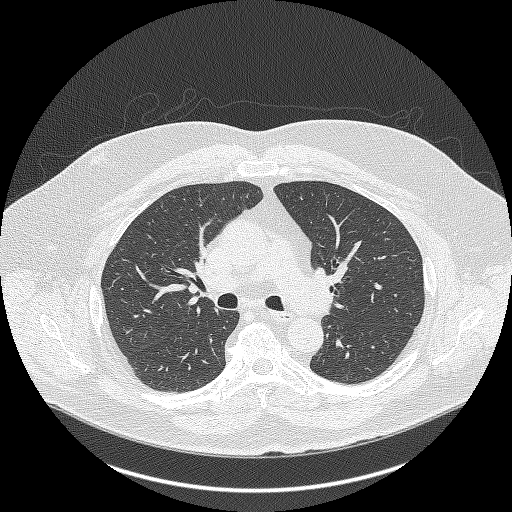
[im 234/344  mediastinal]
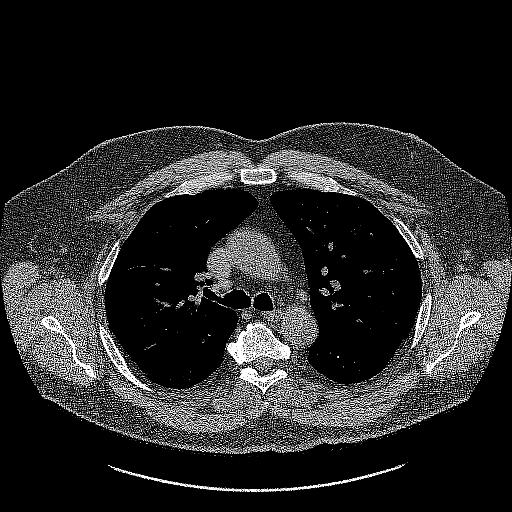
[im 234/344  lung]
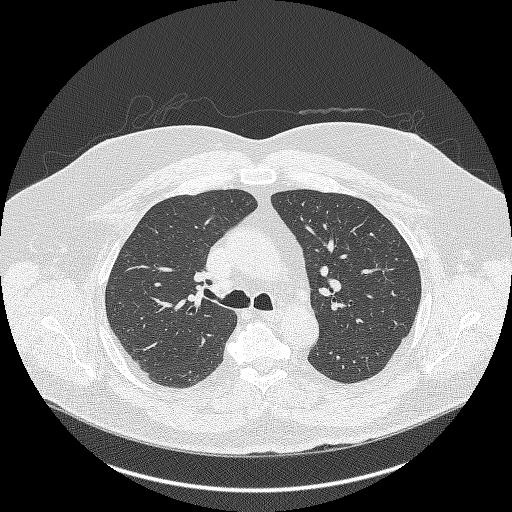
[im 266/344  lung]
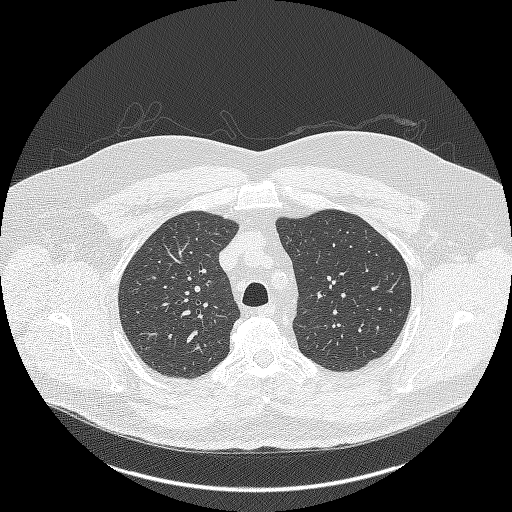
[im 297/344  lung]
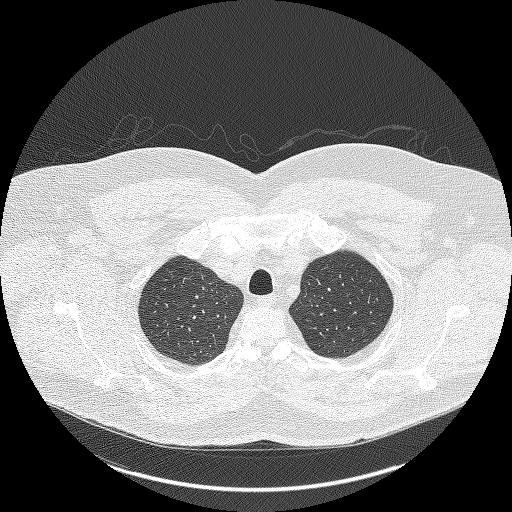
[im 328/344  lung]
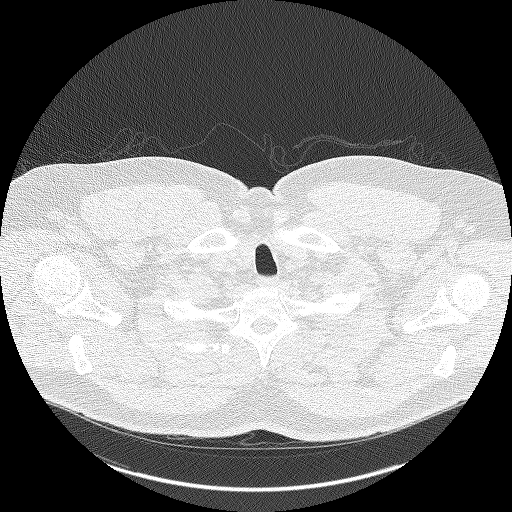

[Series 5: coronals lung 1.00 cor · coronal · 0.67mm/px · 3 of 380 slices shown]
[im 76/380  lung]
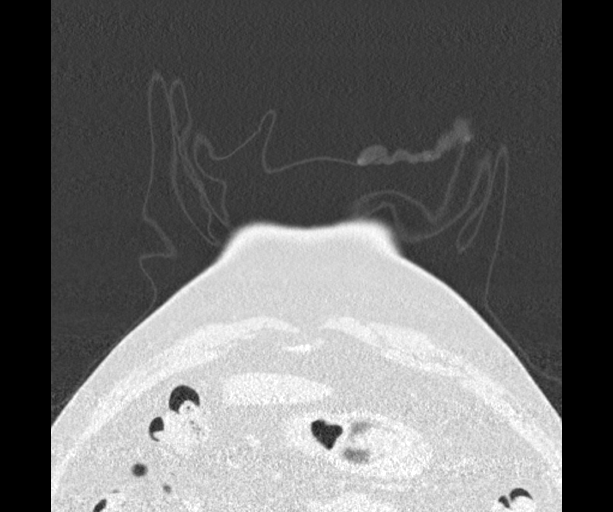
[im 152/380  lung]
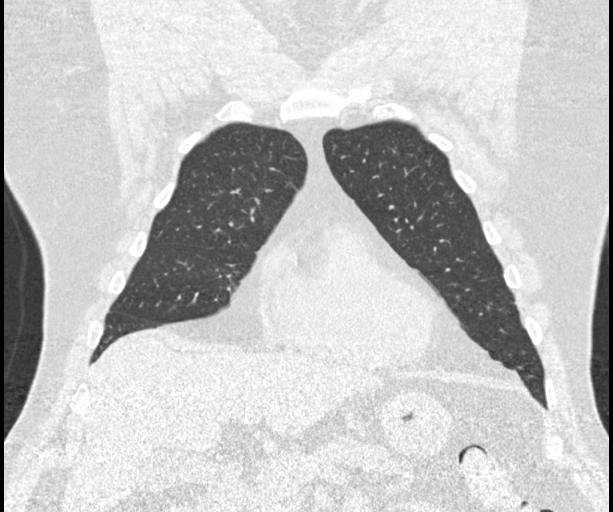
[im 228/380  lung]
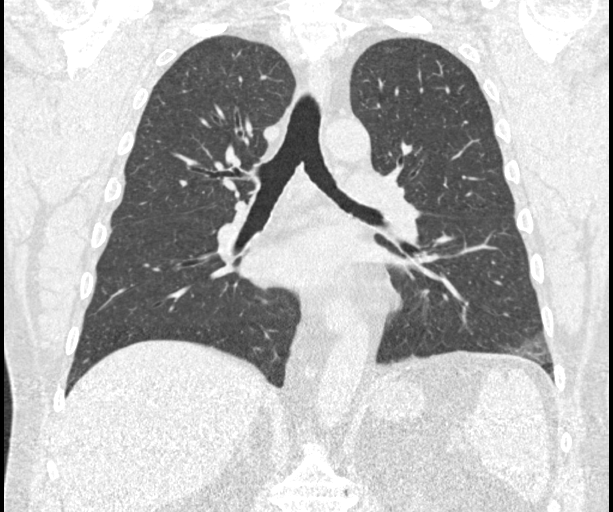

[15 of 40 positions shown; findings below may reference images not displayed]

FINDINGS: Cardiovascular: Coronary artery calcification. Heart size normal. No
pericardial effusion.

Mediastinum/Nodes: No pathologically enlarged mediastinal or
axillary lymph nodes. Hilar regions are difficult to definitively
evaluate without IV contrast. Esophagus is grossly unremarkable.

Lungs/Pleura: Mild centrilobular emphysema. Smoking related
respiratory bronchiolitis. Scarring in the lingula and left lower
lobe. Pulmonary nodules measure 2.3 mm or less in size, as before.
No new pulmonary nodules. No pleural fluid. Airway is unremarkable.

Upper Abdomen: Visualized portions of the liver, gallbladder,
adrenal glands, left kidney, spleen, pancreas, stomach and bowel are
grossly unremarkable.

Musculoskeletal: Degenerative changes in the spine. No worrisome
lytic or sclerotic lesions.
IMPRESSION: 1. Lung-RADS 2, benign appearance or behavior. Continue annual
screening with low-dose chest CT without contrast in 12 months.
2. Coronary artery calcification.
3.  Emphysema ([id number]).

## 2022-07-11 ENCOUNTER — Ambulatory Visit: Payer: 59

## 2022-07-11 ENCOUNTER — Ambulatory Visit
Admission: RE | Admit: 2022-07-11 | Discharge: 2022-07-11 | Disposition: A | Discharge status: Home / Self Care | Payer: 59 | Source: Ambulatory Visit | Attending: Acute Care | Admitting: Acute Care

## 2022-07-11 DIAGNOSIS — Z87891 Personal history of nicotine dependence: Secondary | ICD-10-CM

## 2022-07-11 DIAGNOSIS — F17210 Nicotine dependence, cigarettes, uncomplicated: Secondary | ICD-10-CM

## 2022-07-11 DIAGNOSIS — Z122 Encounter for screening for malignant neoplasm of respiratory organs: Secondary | ICD-10-CM

## 2022-07-17 ENCOUNTER — Other Ambulatory Visit: Payer: Self-pay

## 2022-07-17 DIAGNOSIS — F17210 Nicotine dependence, cigarettes, uncomplicated: Secondary | ICD-10-CM

## 2022-07-17 DIAGNOSIS — Z122 Encounter for screening for malignant neoplasm of respiratory organs: Secondary | ICD-10-CM

## 2022-07-17 DIAGNOSIS — Z87891 Personal history of nicotine dependence: Secondary | ICD-10-CM

## 2023-06-24 ENCOUNTER — Other Ambulatory Visit: Payer: Self-pay | Admitting: Emergency Medicine

## 2023-06-24 DIAGNOSIS — F17210 Nicotine dependence, cigarettes, uncomplicated: Secondary | ICD-10-CM

## 2023-06-24 DIAGNOSIS — Z122 Encounter for screening for malignant neoplasm of respiratory organs: Secondary | ICD-10-CM

## 2023-07-10 ENCOUNTER — Ambulatory Visit
Admission: RE | Admit: 2023-07-10 | Discharge: 2023-07-10 | Disposition: A | Discharge status: Home / Self Care | Source: Ambulatory Visit | Attending: Emergency Medicine

## 2023-07-10 DIAGNOSIS — F17210 Nicotine dependence, cigarettes, uncomplicated: Secondary | ICD-10-CM | POA: Insufficient documentation

## 2023-07-10 DIAGNOSIS — Z122 Encounter for screening for malignant neoplasm of respiratory organs: Secondary | ICD-10-CM | POA: Diagnosis present
